# Patient Record
Sex: Male | Born: 1955 | Race: White | Hispanic: No | Marital: Married | Smoking: Current every day smoker
Health system: Southern US, Community
[De-identification: ages and names within clinical notes are randomized; demographics above are authoritative.]

## PROBLEM LIST (undated history)

## (undated) DIAGNOSIS — E785 Hyperlipidemia, unspecified: Secondary | ICD-10-CM

## (undated) DIAGNOSIS — N19 Unspecified kidney failure: Secondary | ICD-10-CM

## (undated) DIAGNOSIS — I10 Essential (primary) hypertension: Secondary | ICD-10-CM

## (undated) DIAGNOSIS — I219 Acute myocardial infarction, unspecified: Secondary | ICD-10-CM

## (undated) HISTORY — DX: Acute myocardial infarction, unspecified: I21.9

## (undated) HISTORY — DX: Essential (primary) hypertension: I10

## (undated) HISTORY — DX: Hyperlipidemia, unspecified: E78.5

---

## 2000-07-08 ENCOUNTER — Ambulatory Visit (HOSPITAL_COMMUNITY): Admission: RE | Admit: 2000-07-08 | Discharge: 2000-07-08 | Payer: Self-pay | Admitting: Orthopedic Surgery

## 2000-07-08 ENCOUNTER — Encounter (INDEPENDENT_AMBULATORY_CARE_PROVIDER_SITE_OTHER): Payer: Self-pay | Admitting: Specialist

## 2004-09-13 ENCOUNTER — Emergency Department (HOSPITAL_COMMUNITY): Admission: EM | Admit: 2004-09-13 | Discharge: 2004-09-13 | Payer: Self-pay | Admitting: Emergency Medicine

## 2004-10-02 ENCOUNTER — Encounter: Admission: RE | Admit: 2004-10-02 | Discharge: 2004-10-02 | Payer: Self-pay | Admitting: Family Medicine

## 2005-12-13 ENCOUNTER — Ambulatory Visit: Payer: Self-pay | Admitting: Family Medicine

## 2005-12-14 ENCOUNTER — Inpatient Hospital Stay (HOSPITAL_COMMUNITY): Admission: EM | Admit: 2005-12-14 | Discharge: 2005-12-15 | Payer: Self-pay | Admitting: *Deleted

## 2005-12-14 ENCOUNTER — Ambulatory Visit: Payer: Self-pay | Admitting: Internal Medicine

## 2005-12-20 ENCOUNTER — Ambulatory Visit: Payer: Self-pay | Admitting: Family Medicine

## 2005-12-27 ENCOUNTER — Ambulatory Visit: Payer: Self-pay | Admitting: Cardiovascular Disease

## 2006-02-10 ENCOUNTER — Encounter (HOSPITAL_COMMUNITY): Admission: RE | Admit: 2006-02-10 | Discharge: 2006-05-11 | Payer: Self-pay | Admitting: Cardiovascular Disease

## 2006-02-10 ENCOUNTER — Ambulatory Visit: Payer: Self-pay | Admitting: Cardiology

## 2006-02-14 ENCOUNTER — Ambulatory Visit: Payer: Self-pay | Admitting: Cardiovascular Disease

## 2006-07-14 ENCOUNTER — Ambulatory Visit: Payer: Self-pay | Admitting: Family Medicine

## 2006-07-15 ENCOUNTER — Ambulatory Visit: Payer: Self-pay | Admitting: Cardiology

## 2006-07-28 ENCOUNTER — Encounter: Payer: Self-pay | Admitting: Cardiology

## 2006-07-28 ENCOUNTER — Ambulatory Visit: Payer: Self-pay

## 2006-08-03 ENCOUNTER — Ambulatory Visit: Payer: Self-pay | Admitting: Cardiovascular Disease

## 2006-09-30 ENCOUNTER — Ambulatory Visit: Payer: Self-pay | Admitting: Family Medicine

## 2006-10-06 ENCOUNTER — Encounter: Admission: RE | Admit: 2006-10-06 | Discharge: 2006-10-06 | Payer: Self-pay | Admitting: Family Medicine

## 2006-11-18 ENCOUNTER — Ambulatory Visit: Payer: Self-pay | Admitting: Cardiovascular Disease

## 2006-11-18 LAB — CONVERTED CEMR LAB
ALT: 43 units/L (ref 0–53)
AST: 40 units/L — ABNORMAL HIGH (ref 0–37)
Alkaline Phosphatase: 87 units/L (ref 39–117)
Bilirubin, Direct: 0.1 mg/dL (ref 0.0–0.3)
Cholesterol: 117 mg/dL (ref 0–200)
HDL: 40.5 mg/dL (ref >39.0)
LDL Cholesterol: 49 mg/dL (ref 0–99)
Total Bilirubin: 0.6 mg/dL (ref 0.3–1.2)
Total Protein: 5.8 g/dL — ABNORMAL LOW (ref 6.0–8.3)

## 2007-02-23 ENCOUNTER — Ambulatory Visit: Payer: Self-pay | Admitting: Family Medicine

## 2007-06-13 ENCOUNTER — Ambulatory Visit: Payer: Self-pay | Admitting: Family Medicine

## 2008-01-09 ENCOUNTER — Ambulatory Visit: Payer: Self-pay | Admitting: Family Medicine

## 2008-03-19 ENCOUNTER — Ambulatory Visit: Payer: Self-pay | Admitting: Cardiovascular Disease

## 2008-09-05 ENCOUNTER — Telehealth: Payer: Self-pay | Admitting: Cardiovascular Disease

## 2008-10-28 ENCOUNTER — Encounter: Payer: Self-pay | Admitting: Cardiovascular Disease

## 2008-10-28 ENCOUNTER — Ambulatory Visit: Payer: Self-pay | Admitting: Family Medicine

## 2009-02-05 ENCOUNTER — Encounter: Payer: Self-pay | Admitting: Cardiology

## 2009-02-26 ENCOUNTER — Encounter (INDEPENDENT_AMBULATORY_CARE_PROVIDER_SITE_OTHER): Payer: Self-pay | Admitting: *Deleted

## 2009-06-10 ENCOUNTER — Ambulatory Visit: Payer: Self-pay | Admitting: Family Medicine

## 2009-07-01 ENCOUNTER — Encounter: Admission: RE | Admit: 2009-07-01 | Discharge: 2009-07-01 | Payer: Self-pay | Admitting: Orthopedic Surgery

## 2009-07-10 ENCOUNTER — Ambulatory Visit: Payer: Self-pay | Admitting: Family Medicine

## 2009-08-27 ENCOUNTER — Ambulatory Visit: Payer: Self-pay | Admitting: Family Medicine

## 2009-08-28 ENCOUNTER — Ambulatory Visit: Payer: Self-pay | Admitting: Family Medicine

## 2009-09-03 ENCOUNTER — Ambulatory Visit: Payer: Self-pay | Admitting: Family Medicine

## 2010-03-24 ENCOUNTER — Ambulatory Visit: Payer: Self-pay | Admitting: Cardiovascular Disease

## 2010-03-24 DIAGNOSIS — I2510 Atherosclerotic heart disease of native coronary artery without angina pectoris: Secondary | ICD-10-CM

## 2010-03-24 DIAGNOSIS — E7800 Pure hypercholesterolemia, unspecified: Secondary | ICD-10-CM | POA: Insufficient documentation

## 2010-05-30 ENCOUNTER — Encounter: Payer: Self-pay | Admitting: Cardiovascular Disease

## 2010-06-09 NOTE — Assessment & Plan Note (Signed)
Summary: f2y   Visit Type:  2 YEAR FOLLOW UP Primary Provider:  Dr Susann Givens  CC:  no complaints.  History of Present Illness: 55 year-old male with CAD presents for followup evaluation. He has not been seen in 2 years. He initially presented with ACS and underwent stenting of the LCx with a drug-eluting stent. He had moderate RCA stenosis and underwent a myocardial perfusion scan without ischemia.   He denies chest pain or tightness. No dyspnea, edema, orthopnea, or PND. No palpitations, lightheadedness, or syncope.  He continues to smoke a few cigarettes each day. He is not engaged in regular exercise.    Current Medications (verified): 1)  Benicar Hct 40-25 Mg Tabs (Olmesartan Medoxomil-Hctz) .... 1/2 Tablet Once Daily 2)  Crestor 20 Mg Tabs (Rosuvastatin Calcium) .... Take One Tablet By Mouth Daily. 3)  Metoprolol Tartrate 25 Mg Tabs (Metoprolol Tartrate) .... Take 2 Tablets in The Morning and 1 Tablet in The Evening 4)  Plavix 75 Mg Tabs (Clopidogrel Bisulfate) .... Take One Tablet By Mouth Daily 5)  Aspirin 81 Mg Tbec (Aspirin) .... Take One Tablet By Mouth Daily  Allergies (verified): No Known Drug Allergies  Past History:  Past medical history reviewed for relevance to current acute and chronic problems.  Past Medical History: CAD s/p PCI 2007 Tobacco abuse HTN Dyslipidemia Chronic low back pain  Social History: Married, 2 children (one grown) Works at [employer] Positive tobacco Social Etoh  Review of Systems       Positive for chronic low back pain and numbness down the right leg, otherwise negative except as per HPI  Vital Signs:  Patient profile:   55 year old male Height:      72 inches Weight:      157.50 pounds BMI:     21.44 Pulse rate:   103 / minute BP sitting:   138 / 84  (left arm) Cuff size:   regular  Vitals Entered By: Caralee Ates CMA (March 24, 2010 11:07 AM)  Physical Exam  General:  Pt is alert and oriented, in no acute  distress. HEENT: normal Neck: normal carotid upstrokes without bruits, JVP normal Lungs: CTA CV: RRR without murmur or gallop Abd: soft, NT, positive BS, no bruit, no organomegaly Ext: no clubbing, cyanosis, or edema. peripheral pulses 2+ and equal Skin: warm and dry without rash    EKG  Procedure date:  03/24/2010  Findings:      Sinus tachycardia 103 bpm, rightward axis, nonspecific T wave changes.  Impression & Recommendations:  Problem # 1:  CORONARY ATHEROSCLEROSIS NATIVE CORONARY ARTERY (ICD-414.01) Pt is stable without angina. He is tolerating dual antiplatelet Rx without bleeding problems. We reviewed the risk/benefit of continuing ASA and plavix and decided to continue these meds. Pt was counseled regarding the importance of tobacco cessation. Otherwise continue current medical program.  His updated medication list for this problem includes:    Metoprolol Tartrate 25 Mg Tabs (Metoprolol tartrate) .Marland Kitchen... Take 2 tablets in the morning and 1 tablet in the evening    Plavix 75 Mg Tabs (Clopidogrel bisulfate) .Marland Kitchen... Take one tablet by mouth daily    Aspirin 81 Mg Tbec (Aspirin) .Marland Kitchen... Take one tablet by mouth daily  Orders: EKG w/ Interpretation (93000)  Problem # 2:  HYPERCHOLESTEROLEMIA (ICD-272.0) Lipids followed by PCP. Goal LDL less than 70 mg/dL.  His updated medication list for this problem includes:    Crestor 20 Mg Tabs (Rosuvastatin calcium) .Marland Kitchen... Take one tablet by mouth daily.  Orders: EKG w/ Interpretation (93000)  CHOL: 117 (11/18/2006)   LDL: 49 (11/18/2006)   HDL: 40.5 (11/18/2006)   TG: 136 (11/18/2006)  Patient Instructions: 1)  Your physician recommends that you continue on your current medications as directed. Please refer to the Current Medication list given to you today. 2)  Your physician wants you to follow-up in: 1 YEAR.  You will receive a reminder letter in the mail two months in advance. If you don't receive a letter, please call our office to  schedule the follow-up appointment. Prescriptions: METOPROLOL TARTRATE 25 MG TABS (METOPROLOL TARTRATE) Take 2 tablets in the morning and 1 tablet in the evening  #90 Tablet x 12   Entered by:   Julieta Gutting, RN, BSN   Authorized by:   Norva Karvonen, MD   Signed by:   Julieta Gutting, RN, BSN on 03/24/2010   Method used:   Electronically to        [facility] [phone]* (retail)       [address]       [place], [place]  [address]       Ph: [phone] or [phone]       Fax: [phone]   RxID:   [id number]

## 2010-09-15 ENCOUNTER — Other Ambulatory Visit: Payer: Self-pay | Admitting: Family Medicine

## 2010-09-17 ENCOUNTER — Telehealth: Payer: Self-pay | Admitting: Family Medicine

## 2010-09-17 MED ORDER — OLMESARTAN MEDOXOMIL 40 MG PO TABS: 40.0000 mg | ORAL_TABLET | Freq: Every day | ORAL | Status: DC

## 2010-09-17 NOTE — Telephone Encounter (Signed)
med renewed

## 2010-09-18 ENCOUNTER — Encounter: Payer: Self-pay | Admitting: Family Medicine

## 2010-09-18 MED ORDER — OLMESARTAN MEDOXOMIL-HCTZ 40-25 MG PO TABS: 1.0000 | ORAL_TABLET | Freq: Every day | ORAL | Status: DC

## 2010-09-18 NOTE — Progress Notes (Signed)
09/18/10 BENICAR/HCTZ REQUIRING P.A. NEED DISCUSS AT NEXT OV-LM

## 2010-09-18 NOTE — Progress Notes (Deleted)
  Subjective:    Patient ID: AH BOTT, male    DOB: [birth year], 55 y.o.   MRN: [id number]  HPI    Review of Systems     Objective:   Physical Exam        Assessment & Plan:

## 2010-09-22 ENCOUNTER — Encounter: Payer: Self-pay | Admitting: Family Medicine

## 2010-09-22 ENCOUNTER — Ambulatory Visit (INDEPENDENT_AMBULATORY_CARE_PROVIDER_SITE_OTHER): Payer: BC Managed Care – PPO | Admitting: Family Medicine

## 2010-09-22 VITALS — BP 130/90 | HR 80 | Ht 61.32 in | Wt 160.0 lb

## 2010-09-22 DIAGNOSIS — Z125 Encounter for screening for malignant neoplasm of prostate: Secondary | ICD-10-CM

## 2010-09-22 DIAGNOSIS — N529 Male erectile dysfunction, unspecified: Secondary | ICD-10-CM | POA: Insufficient documentation

## 2010-09-22 DIAGNOSIS — I10 Essential (primary) hypertension: Secondary | ICD-10-CM | POA: Insufficient documentation

## 2010-09-22 DIAGNOSIS — E785 Hyperlipidemia, unspecified: Secondary | ICD-10-CM

## 2010-09-22 DIAGNOSIS — Z0000 Encounter for general adult medical examination without abnormal findings: Secondary | ICD-10-CM

## 2010-09-22 DIAGNOSIS — Z23 Encounter for immunization: Secondary | ICD-10-CM

## 2010-09-22 DIAGNOSIS — I2510 Atherosclerotic heart disease of native coronary artery without angina pectoris: Secondary | ICD-10-CM

## 2010-09-22 DIAGNOSIS — M549 Dorsalgia, unspecified: Secondary | ICD-10-CM

## 2010-09-22 LAB — COMPREHENSIVE METABOLIC PANEL
ALT: 38 U/L (ref 0–53)
AST: 39 U/L — ABNORMAL HIGH (ref 0–37)
BUN: 16 mg/dL (ref 6–23)
Calcium: 9.4 mg/dL (ref 8.4–10.5)
Chloride: 99 mEq/L (ref 96–112)
Creat: 1.07 mg/dL (ref 0.40–1.50)
Total Bilirubin: 0.7 mg/dL (ref 0.3–1.2)

## 2010-09-22 LAB — POCT URINALYSIS DIPSTICK
Blood, UA: NEGATIVE
Glucose, UA: NEGATIVE
Nitrite, UA: NEGATIVE
Protein, UA: NEGATIVE
Spec Grav, UA: 1.015
Urobilinogen, UA: NEGATIVE
pH, UA: 5

## 2010-09-22 LAB — CBC WITH DIFFERENTIAL/PLATELET
Basophils Absolute: 0 10*3/uL (ref 0.0–0.1)
Eosinophils Absolute: 0.1 10*3/uL (ref 0.0–0.7)
Eosinophils Relative: 1 % (ref 0–5)
HCT: 42 % (ref 39.0–52.0)
Lymphocytes Relative: 33 % (ref 12–46)
MCH: 33.7 pg (ref 26.0–34.0)
MCHC: 33.6 g/dL (ref 30.0–36.0)
MCV: 100.5 fL — ABNORMAL HIGH (ref 78.0–100.0)
Monocytes Absolute: 0.6 10*3/uL (ref 0.1–1.0)
Platelets: 187 10*3/uL (ref 150–400)
RDW: 13.2 % (ref 11.5–15.5)

## 2010-09-22 LAB — LIPID PANEL
Cholesterol: 174 mg/dL (ref 0–200)
HDL: 55 mg/dL (ref >39)
Total CHOL/HDL Ratio: 3.2 Ratio
VLDL: 38 mg/dL (ref 0–40)

## 2010-09-22 MED ORDER — LOSARTAN POTASSIUM-HCTZ 50-12.5 MG PO TABS: 1.0000 | ORAL_TABLET | Freq: Every day | ORAL | Status: DC

## 2010-09-22 NOTE — Patient Instructions (Signed)
Your new blood pressure medicine was called in. Continue to work on smoking cessation and return here if you want more help. Her here in one month for blood pressure recheck. Change to work on your physical activities to increase this as much as possible.

## 2010-09-22 NOTE — Assessment & Plan Note (Signed)
[facility]                            CARDIOLOGY OFFICE NOTE   MITESH, ROSENDAHL                   MRN:          [id number]  DATE:03/19/2008                            DOB:          September 07, 1955    REASON FOR VISIT:  CAD.   HISTORY OF PRESENT ILLNESS:  Mr. Harriott is a 55 year old gentleman  with CAD who initially presented with an acute coronary syndrome in  2007.  He was found to have a severe ulcerated plaque in the left  circumflex and was treated with a drug-eluting stent.  He has done quite  well since his initial intervention.  He had an episode of chest pain in  2008, and underwent a nuclear stress study dated July 28, 2006.  This  demonstrated normal homogenous uptake with normal perfusion in all areas  and an ejection fraction of 68%.  From a symptomatic standpoint, he is  currently doing well.  He has no complaints.  He specifically denies  chest pain, dyspnea, edema, palpitations, lightheadedness, or syncope.   He is active, but does not engage in any formal exercise program.  He  has no symptoms with exertion.   MEDICATIONS:  1. Plavix 75 mg daily.  2. Benicar 20 mg daily.  3. Aspirin 81 mg daily.  4. Crestor 10 mg daily.  5. Metoprolol 25 mg two in the morning and one in the evening   ALLERGIES:  NKDA.   PHYSICAL EXAMINATION:  GENERAL:  The patient is alert and oriented.  He  is in no acute distress.  VITAL SIGNS:  Blood pressure 140/90, heart rate 82, respiratory rates  12, weight is 160 pounds.  HEENT:  Normal.  NECK:  Normal carotid upstrokes.  No bruits.  JVP normal.  LUNGS:  Clear bilaterally.  HEART:  Regular rate and rhythm.  No murmurs or gallops.  ABDOMEN:  Soft, nontender.  No organomegaly.  EXTREMITIES:  No clubbing, cyanosis, or edema.  Peripheral pulses are  intact and equal.  SKIN:  Warm and dry.  No rash.   EKG shows normal sinus rhythm and is within normal limits.  Heart rate  is 81 beats per  minute.   ASSESSMENT:  1. Coronary artery disease.  The patient is stable with no new      symptoms.  He is tolerating his medical program well.  I encouraged      him to do more vigorous exercise on a regular basis.  I would like      to see him back in 1 year for followup.  2. Hypertension.  Blood pressure borderline in the office today.  He      reports good control at home.  Continue current medical program.  3. Dyslipidemia.  His last lipid panel was drawn on January 09, 2008,      by Dr. Susann Givens and showed a cholesterol of 163, triglyceride of      137, HDL 65, and LDL 71.  He is at goal with a very good HDL.  We      will continue low-dose Crestor without change.   For  followup, I will see Mr. Stanbery in 1 year.     Veverly Fells. Excell Seltzer, MD  Electronically Signed    MDC/MedQ  DD: 03/26/2008  DT: 03/26/2008  Job #: [id number]   cc:   Sharlot Gowda, M.D.

## 2010-09-22 NOTE — Progress Notes (Signed)
Subjective:    Patient ID: George Benson, male    DOB: 01/08/1956, 55 y.o.   MRN: [id number]  HPI he is here for a complete examination. He continues on medications listed in the chart. He'll need his blood pressure medicine changed due to insurance purposes. He has not had any difficulty with chest pain, shortness of breath. He has seen his cardiologist recently. He has been having difficulty with back pain. He has a previous history of epidural injections which did work quite well. He is considering having these again. He does complain of numbness in his toes but then states that he can feel the same in  his feet. Continues to smoke but is making an effort to quit smoking. He realizes he is not exercising as much as he should. Work continues to be stressful which she says contributes to smoking. His home life is going well. Family history is unchanged.   Review of Systems  Constitutional: Negative.   HENT: Negative.   Respiratory: Negative.   Gastrointestinal: Negative.   Genitourinary: Negative.   Musculoskeletal: Negative.   Skin: Negative.   Hematological: Negative.  Negative for adenopathy.  Psychiatric/Behavioral: Negative.        Objective:   Physical Exam BP 130/90  Pulse 80  Ht 5' 1.32" (1.558 m)  Wt 160 lb (72.576 kg)  BMI 29.92 kg/m2  General Appearance:    Alert, cooperative, no distress, appears stated age  Head:    Normocephalic, without obvious abnormality, atraumatic  Eyes:    PERRL, conjunctiva/corneas clear, EOM's intact, fundi    benign  Ears:    Normal TM's and external ear canals  Nose:   Nares normal, mucosa normal, no drainage or sinus   tenderness  Throat:   Lips, mucosa, and tongue normal; teeth and gums normal  Neck:   Supple, no lymphadenopathy;  thyroid:  no   enlargement/tenderness/nodules; no carotid   bruit or JVD  Back:    Spine nontender, no curvature, ROM normal, no CVA     tenderness  Lungs:     Clear to auscultation bilaterally without  wheezes, rales or     ronchi; respirations unlabored  Chest Wall:    No tenderness or deformity   Heart:    Regular rate and rhythm, S1 and S2 normal, no murmur, rub   or gallop  Breast Exam:    No chest wall tenderness, masses or gynecomastia  Abdomen:     Soft, non-tender, nondistended, normoactive bowel sounds,    no masses, no hepatosplenomegaly  Genitalia:    Normal male external genitalia without lesions.  Testicles without masses.  No inguinal hernias.     Extremities:   No clubbing, cyanosis or edema  Pulses:   2+ and symmetric all extremities  Skin:   Skin color, texture, turgor normal, no rashes or lesions  Lymph nodes:   Cervical, supraclavicular, and axillary nodes normal  Neurologic:   CNII-XII intact, normal strength, sensation and gait; reflexes 2+ and symmetric throughout          Psych:   Normal mood, affect, hygiene and grooming.          Assessment & Plan:  see chronic problem list Discussed smoking cessation with him. Encouraged him to continue with his present slow reduction. Also encouraged him to become more physically active. We discussed stress and stress management especially using the serenity prayer. Will be switched to different hypertensive medication. He is to return here in one month for recheck.

## 2010-09-24 ENCOUNTER — Telehealth: Payer: Self-pay

## 2010-09-24 NOTE — Telephone Encounter (Signed)
Pt wife aware of labs she said she would let him know

## 2010-09-25 NOTE — Assessment & Plan Note (Signed)
[facility]                            CARDIOLOGY OFFICE NOTE   George Benson, George Benson                   MRN:          [id number]  DATE:08/03/2006                            DOB:          08-18-1955    George Benson is a 55 year old gentleman with coronary artery disease  and a recent episode of chest pain, presenting today for followup after  an exercise Myoview stress test.   He was seen by George Benson in the clinic on March 7 for evaluation  after an episode of substernal chest tightness, as well as shoulder  tightness, light-headedness and diaphoresis.  This occurred at rest.  He  did not have any exertional symptoms.  Since that visit, he has done  well.  He has had a few episodes of fleeting chest pain, following  lunch.  He has not had any exertional symptoms.  He has undergone an  exercise Myoview study and had no chest pain during exercise on the  treadmill.  His episodes of pain following eating have occurred on a few  occasions and last for a matter of seconds, then resolve spontaneously.  He has been active and has performed activities such as mowing his lawn  and golfing, as well as playing with his young son.  He has had no  symptoms with activity.   CURRENT MEDICATIONS INCLUDE:  1. Plavix 75 mg daily.  2. Aspirin 81 mg daily.  3. Lopressor 25 mg twice daily.  4. Benicar 20 mg daily.  5. Lipitor 80 mg at bedtime.   ALLERGIES:  NKDA.   EXAMINATION:  The patient is alert and oriented.  He is in no acute  distress.  Weight is 162 pounds, blood pressure is 131/86, heart rate is  71, respiratory rate is 12.  HEENT:  Normal.  NECK:  Normal carotid upstrokes without bruits.  Jugular venous pressure  is normal.  LUNGS:  Clear to auscultation bilaterally.  HEART:  The apex is discrete and nondisplaced.  The heart is regular  rate and rhythm without murmurs or gallops.  ABDOMEN:  Soft, nontender, no organomegaly, no abdominal  bruits.  EXTREMITIES:  There is no clubbing, cyanosis or edema.  There is diffuse  bruising of the right arm, from the elbow to the wrist.  Peripheral  pulses are 2+ and equal throughout.   Exercise Myoview stress study, performed on March 20, as follows:  Mr.  Benson exercised for 8 minutes on the Bruce protocol.  He had no  chest pain or significant ST or T-wave changes.  Nuclear images were  normal with no evidence of ischemia.  The gated ejection fraction was  68%.  There were no arrhythmias.   Echocardiogram, performed on March, demonstrated normal left ventricular  systolic function and mild aortic valve regurgitation.  There were no  other significant abnormalities seen.  The calculated LVEF was 58%.   ASSESSMENT:  George Benson is stable from a cardiac standpoint.  His  cardiac problems are as follows:   1. Coronary artery disease.  He remains on dual antiplatelet therapy      with aspirin  and clopidogrel.  He will be at one year of dual      antiplatelet therapy in August of this year.  At that point, we      will have a discussion to see whether we will continue him on      clopidogrel.  The easy bruisability is a major problem and likely      we will discontinue clopidogrel at that point.  He should remain on      aggressive secondary risk factor modification, as detailed below.      His exercise Myoview study is reassuring and I think his chest pain      episodes are unrelated to ischemic heart disease.  2. Hypertension.  He continues on metoprolol and Benicar.  I have      asked him to increase his metoprolol to 50 mg in the morning and 25      mg in the evening.  He will remain on 20 mg of Benicar daily.  3. Dyslipidemia.  He remains on high-dose Lipitor.  He is tolerating      this well.  He is due for lipids and LFTs in June of this year.      His last LFTs were in October of 2007 and his AST was 39 and ALT      was 40.  His LDL cholesterol was 69 and total  cholesterol was 149.      His HDL was 57.   FOLLOWUP:  I will plan on seeing George Benson back in 6 months.  We  will follow up with him by telephone after his lipids and LFTs are  available in June.     Veverly Fells. Excell Seltzer, MD  Electronically Signed    MDC/MedQ  DD: 08/03/2006  DT: 08/03/2006  Job #: [id number]   cc:   Sharlot Gowda, M.D.

## 2010-09-25 NOTE — Assessment & Plan Note (Signed)
[facility]                            CARDIOLOGY OFFICE NOTE   George, Benson                   MRN:          [id number]  DATE:07/15/2006                            DOB:          [birth year]    PRIMARY:  Dr. Susann Givens.   REASON FOR PRESENTATION:  Evaluate the patient for chest pain.   HISTORY OF PRESENT ILLNESS:  The patient is a 55 year old gentleman with  coronary disease as described below.  He last saw Dr. Excell Seltzer in October  and had a stress perfusion study to follow up some nonobstructive  disease and his recent stent.  There was no ischemia.  The EF was 62%.   He had been doing well.  He walks a lot at work and walks quickly.  He  walks with his 69-year-old son as well.  With this, he has not had any  chest discomfort, neck discomfort, or arm discomfort.  He has had no  palpitations, pre-syncope, or syncope.  He has had no shortness of  breath.   Yesterday, while sitting at his desk, he developed chest discomfort  similar to that, which he had with his previous pain.  He described a  feeling like his blood pressure was going up.  He developed bilateral  shoulder tightness.  He felt light-headed.  He got slightly diaphoretic.  He developed tremor.  His hands began to tingle.  He took a  nitroglycerin.  The symptoms improved.  While this was similar to his  previous complaints, it was not as severe.  He had no recurrent symptoms  following this.  He has not had any problems overnight.  He saw Dr.  Susann Givens and is referred here.   Of note, when he presented with similar complaints previously, he did  not have a myocardial infarction.  He did have disease as described  below.   PAST MEDICAL HISTORY:  Coronary artery disease (LAD mid 40% stenosis,  second diagonal 75% stenosis, the circumflex had proximal 30% stenosis,  mid circumflex had a 90% lesion before an obtuse marginal, the first  obtuse marginal had 60% stenosis.  The right  coronary artery had a  proximal 50% stenosis, the mid right coronary artery had 60% stenosis.  The EF was 60%).  Hypertension.  Dyslipidemia.  Tobacco abuse.   ALLERGIES:  None.   MEDICATIONS:  1. Plavix 75 mg daily.  2. Lipitor 80 mg nightly.  3. Benicar 20 mg daily.  4. Lopressor 25 mg b.i.d.  5. Aspirin 81 mg daily.   REVIEW OF SYSTEMS:  As stated in the HPI and otherwise negative for  other systems.   PHYSICAL EXAMINATION:  The patient is in no distress.  Blood pressure 146/97, heart rate 64 and regular, weight 160 pounds,  body mass index 21.  HEENT:  Eyelids unremarkable.  Pupils are equal, round, and reactive to  light and accommodation.  Fundi not visualized.  Oral mucosa  unremarkable.  NECK:  No jugular venous distension at 45 degrees, carotid upstroke  brisk and symmetric, no bruits, thyromegaly.  LYMPHATICS:  No cervical, axillary, or inguinal adenopathy.  LUNGS:  Clear to auscultation bilaterally.  BACK:  No costovertebral angle tenderness.  CHEST:  Unremarkable.  HEART:  PMI not displaced or sustained, S1 and S2 within normal limits,  no S3, no S4, positive midsystolic click, slight systolic murmur, non-  radiating, heard only at the apex.  No diastolic murmurs.  ABDOMEN:  Flat, positive bowel sounds, normal in frequency and pitch, no  bruits, rebound, guarding.  No midline pulsatile masses, hepatomegaly.  SKIN:  No rashes, no nodules.  EXTREMITIES:  With 2+ pulses throughout, no edema, cyanosis, clubbing.  NEURO:  Oriented to person, place, and time, cranial nerves 2-12 grossly  intact, motor grossly intact throughout.   ELECTROCARDIOGRAM:  Sinus rhythm, rate 64, axis within normal limits.  Intervals within normal limits.  No acute ST-T wave changes.   ASSESSMENT AND PLAN:  Chest discomfort.  The patient had 1 episode of  chest discomfort yesterday.  This was similar to previous pain, at which  time he had ruled out for myocardial infarction.  He did have   obstructive coronary disease at that time that was treated.  However,  the pain now has predominantly atypical features.  I think the pre-test  probability that this is a recurrent obstructive coronary disease or new  obstructive high-grade large vessel disease is only moderate.  I think  in this situation, it is prudent to start with an exercise perfusion  study.  I think a catheterization might be somewhat confusing as we know  he has some moderate disease and it might be difficult to identify a  culprit.  He is going to continue with his sublingual nitroglycerin and  other medications.     George Rotunda, MD, [facility]  Electronically Signed    JH/MedQ  DD: 07/15/2006  DT: 07/15/2006  Job #: [id number]

## 2010-09-25 NOTE — Cardiovascular Report (Signed)
NAMESAHIB, PELLA NO.:  [id number]   MEDICAL RECORD NO.:  [id number]          PATIENT TYPE:  INP   LOCATION:  2807                         FACILITY:  [facility]   PHYSICIAN:  Micheline Chapman, MD   DATE OF BIRTH:  [birth year]   DATE OF PROCEDURE:  12/14/2005  DATE OF DISCHARGE:                              CARDIAC CATHETERIZATION   PERFORMING PHYSICIAN:  Micheline Chapman, MD   PROCEDURE:  1. Left heart catheterization.  2. Selective coronary angiogram.  3. Left ventricular angiogram.  4. Percutaneous transluminal coronary angioplasty and stenting of the      second obtuse marginal of the left circumflex.  5. AngioSeal closure of the right femoral artery.   INDICATIONS:  Mr. Coupe is a very pleasant, 55 year old male who had  acute onset of chest pressure, diaphoresis and lightheadedness this morning;  and presented to the emergency department with some mild hyperacute,  inferior T wave changes; and isolated PVCs on electrocardiogram.  His pain  improved with aspirin and sublingual nitroglycerin.  With this classic  presentation, he was referred for urgent cardiac catheterization.   Risks and indications were reviewed with the patient and his wife.  They  fully understood and informed consent was obtained.  The right groin was  prepped and draped in a normal sterile fashion and anesthetized with 1%  lidocaine.  Arterial access was achieved using the modified Seldinger  technique without problems.  A 6-French arterial sheath was placed in the  right femoral artery.  Diagnostic catheters included a 6-French angled  pigtail JL-4, JR-4 and no torque right.  All catheter exchanges were  performed over a wire.   FINDINGS:  1. Aortic pressure was 117/85 with a mean of 99.  2. Left ventricular pressure was 115/0 with an end-diastolic pressure of      4.  There was no transaortic valve gradient.  3. The left mainstem was normal.  LAD had mild nonobstructive  disease      proximally of about 20%.  The mid-LAD had 40% disease and the remainder      of the distal LAD was normal.  The LAD gave off 2 diagonal branches,      the second of which was the largest branch.  It had a 75% ostial      stenosis.  4. The left circumflex had 30% proximal disease.  The mid left circumflex      had a 90% lesion and distally this branched into the second obtuse      marginal and was normal.  The left circumflex also gave off a first      obtuse marginal which had 60% ostial disease.  The AV groove circumflex      had no disease and was small diameter.  5. Right coronary artery.  There was diffuse disease in the proximal and      mid right coronary artery.  Proximally the vessel had 50% stenosis.  In      the mid right coronary artery there was a focal 60% stenosis.  The      remainder of the  right coronary artery appeared angiographically      normal.  It ended in a posterior descending branch and was a dominant      vessel.   LEFT VENTRICULOGRAM:  Done in the RAO projection demonstrated normal left  ventricular function with an estimated left ventricular ejection fraction of  60%.   Because of the acute presentation and high-grade disease in the left  circumflex system with the 90% stenosis of the second obtuse marginal, we  proceeded with percutaneous angioplasty and stenting of this vessel.  The  patient was on heparin and his initial ACT was 226.  We, therefore, used  eptifibatide in addition to heparin for anticoagulation.  A 6-French XB 3.0  guiding catheter was used and the lesion was crossed with a Prowater wire.  The lesion was predilated with a 2.5 x 15 mm balloon and there was a small  dissection plane after balloon dilatation.  Following intracoronary  nitroglycerin a 2.5 x 20 mm Taxus stent was deployed in the second obtuse  marginal which completely covered the dissection plane.  There was an  excellent angiographic result following deployment  of this stent.  The stent  was postdilated with a 2.5 x 15 mm noncompliant balloon. The patient had no  complications.  He did have mild chest discomfort at the end of the  procedure likely due to occlusion of a very small side branch which was  filling late after stent deployment.   ASSESSMENT:  1. Unstable angina with high-grade disease in the left circumflex system,      now status post percutaneous coronary intervention.  2. Moderate right coronary artery disease.  3. Mild-to-moderate LAD disease.  4. Normal left ventricular function.   As above, PTCA and stenting was performed of the second obtuse marginal.  Will plan on 18 hours of Integrilin.  The patient was loaded with 600 mg of  Plavix in the catheterization lab.  We will also plan on a myocardial  perfusion scan in approximately 6 weeks' time to evaluate for right coronary  artery distribution ischemia as he has moderate disease in this vessel.      Micheline Chapman, MD  Electronically Signed     MDC/MEDQ  D:  12/14/2005  T:  12/14/2005  Job:  [id number]   cc:   Arvilla Meres, M.D. [facility]

## 2010-09-25 NOTE — Letter (Signed)
December 27, 2005     Sharlot Gowda, MD  [address]  [place], [place] [address]   RE:  George Benson, George Benson  MR:  [id number] / DOB:  Aug 05, 2055   Dear Dr. Susann Givens:   It was my pleasure to see Lebaron Bautch in followup today after his  recent hospitalization for unstable angina.   As you know, he is a very pleasant 55 year old man who presented to [facility] with unstable angina on August 7 and at that time he underwent  a cardiac catheterization that demonstrated high-grade left circumflex  disease and subsequently underwent angioplasty and stenting of this vessel  with placement of a drug-eluting stent.  Post procedure, he has done well.  He reports today that he has had some bruising and discomfort in his right  groin area.  He has also had some minor swelling in his right ankle.  Other  than that, he has no complaints.  He specifically denies chest pain,  shortness of breath, palpitations, lightheadedness or syncope.  He denies  any claudication symptoms.  He is tolerating his new medications well.  He  does describe easy bruising since he has started aspirin and Plavix in  conjunction.  He has been able to reduce his cigarette usage and is down to  4 cigarettes per day.  His wife is quitting with him and they plan to be  completely off of cigarettes by the end of the week.  He has no other  complaints today.   His current medications include aspirin 325 mg daily, clopidogrel 75 mg  daily, Lipitor 80 mg daily, Benicar 20 mg daily and Lopressor 25 mg twice  daily.  He has nitroglycerin to be used as needed but he has not required  this medication.   The remainder of his past medical, family, social history were reviewed per  his hospital records.  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  He is married with a 57-year-old and 50 year old children.  He has  no family history of coronary artery disease.   On examination today, he is alert and  oriented, healthy-appearing, white  male in no acute distress.  His weight is 152 pounds.  His blood pressure is  124/80 and heart rate is 66.  HEENT:  Sclerae anicteric.  Conjunctivae pink.  Moist oral mucosa.  NECK:  Normal carotid upstrokes without bruits, jugular venous pressure is  normal.  LUNGS:  Clear to auscultation bilaterally.  CARDIOVASCULAR:  The PMI is discrete, nondisplaced.  The heart is regular  rate and rhythm without murmurs, gallops or rubs.  ABDOMEN:  Soft, nontender, no organomegaly.  No abdominal bruits are  present.  EXTREMITIES:  The right groin shows some mild ecchymoses but the right  femoral pulse is normal.  There is no mass.  There is no femoral bruit.  The  left femoral artery is normal to palpation.  The right calf is nontender to  palpation.  There is no calf swelling.  There is trace right pedal edema  with 2+ posterior tibial and dorsalis pedis pulses on the right and left  feet.  NEUROLOGIC:  Exam is grossly intact.   ASSESSMENT AND PLAN:  Mr. Mosie Epstein is a 55 year old gentleman with recent  percutaneous coronary intervention for treatment of unstable angina.  His  problems are as follows:  1. Coronary artery disease.  He remains on dual antiplatelet therapy with      aspirin and Plavix.  I  recommend he continues on this for a minimum of      12 months.  He did have moderate coronary disease in his right coronary      artery at the time of his catheterization.  This did not appear to be      his culprit vessel.  I have recommended that he undergo an exercise      myocardial perfusion scan to evaluate for any ischemia in this region.      If he remains asymptomatic, it would be unlikely that I would proceed      with intervention of this vessel unless he has a large area of ischemia      on his nuclear study.  2. Dyslipidemia.  His lipid panel from the hospital demonstrated an LDL      cholesterol of 123.  His HDL was 52.  Triglycerides level was 89  and      total cholesterol was 193.  He was started on atorvastatin 80 mg daily.      Will recheck a lipid panel when he returns in 6 weeks as well as LFTs      to rule out any hepatotoxicity from his atorvastatin.  3. Hypertension.  Will continue therapy with Benicar and Lopressor.  He      appears to be well-controlled.   Will plan on following up with Mr. Mosie Epstein in approximately 8 weeks' time  and will contact him by telephone after the results of his stress test are  back.  He was advised that if the swelling in  his right foot becomes worse or if he develops more significant right calf  pain then I would like to bring him in for a duplex ultrasound of this leg.    Sincerely,      Micheline Chapman, MD   MDC/MedQ  DD:  12/27/2005  DT:  12/27/2005  Job #:  [id number]

## 2010-09-25 NOTE — Letter (Signed)
February 14, 2006     Sharlot Gowda, M.D.  [address]  [place], [place] [address]   RE:  George, Benson  MRN:  [id number]  /  DOB:  [birth year]   Dear Dr. Susann Givens:   I saw George Benson in follow up at the [facility] this  morning.  As you will recall, he is a very pleasant 55 year old man with  hospital admission in August for unstable angina who is now status post  stenting of his left circumflex with a drug eluting stent.  He has done well  in the interim.  He is maintained on aspirin and Plavix for dual  antiplatelet therapy after his stent.  He has noted very easy bruising since  these medications have been started.  He has multiple bruises on his forearm  and antecubital regions from blood draws, and also has a bruise on his chest  wall from leaning over his car door.  He has had no overt bleeding.   He reports no chest pain or pressure.  He has not had any dyspnea.  He  maintains an active lifestyle. 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  He is up on his feet and he does a  great deal of walking.  He also walks quite a bit at night and plays golf  regularly.  He continues to smoke cigarettes, less than one half pack per  day.  He has no other complaints at this time.   CURRENT MEDICATIONS:  1. Aspirin 81 mg daily.  2. Clopidogrel 75 mg daily.  3. Lipitor 80 mg daily.  4. Benicar 20 mg daily.  5. Lopressor 25 mg b.i.d.   ALLERGIES:  No drug allergies.   PHYSICAL EXAMINATION:  GENERAL:  Alert and oriented in no acute distress.  VITAL SIGNS:  Blood pressure 134/80, weight 154 pounds, heart rate 75,  respirations 12.  HEENT:  Sclerae are anicteric.  Conjunctivae pink.  NECK:  Normal carotid upstrokes without bruits.  Jugular venous pressure is  normal.  LUNGS:  Clear to auscultation bilaterally.  CARDIOVASCULAR:  The apex is discrete and nondisplaced.  HEART:  Regular rate and rhythm without murmurs or  gallops.  ABDOMEN:  Soft and nontender.  The chest wall has an ecchymotic region on  the right anterior chest that appears to be healing.  EXTREMITIES:  No cyanosis, clubbing or edema.  Peripheral pulses are 2+ and  equal throughout.  There is some ecchymoses in the bilateral antecubital  regions.   LABORATORY DATA:  The patient had a lipid panel drawn on October 4.  He has  had an excellent response to high dose Lipitor.  His cholesterol is 149 with  a triglyceride of 116.  His HDL cholesterol is 57 and his LDL cholesterol is  69.   He underwent an Adenosine Myoview stress test on October 4.  This  demonstrated diaphragmatic attenuation versus a small prior inferior  infarct.  There was no ischemia on the study.  His overall ejection fraction  was 62% and his wall motion was normal.   ASSESSMENT:  1. Coronary artery disease.  He remains stable on dual antiplatelet      therapy.  He should continue this for a minimum of 12 months with      indefinite aspirin.  His right coronary artery disease which was      treated medically does not appear to be causing any ischemia as  he had      a low risk stress test result.  He continues to be asymptomatic.  He      should continue on aggressive medical therapy.  2. Dyslipidemia.  He has had an excellent response to a atorvastatin as      his LDL cholesterol has dropped from 123 to 69.  He has had an      increased HDL from 52-57.  I would like him to continue on this      indefinitely.  He needs liver function testing done, and we have      scheduled this for today.  3. Hypertension.  Will continue Benicar and Lopressor.  I opted not to      increase his Lopressor dose today because he complains of some mild      fatigue.  4. Easy bruisability.  This is likely secondary to therapy with aspirin      and Plavix.  Will check a CBC just to rule out thrombocytopenia.   I plan on seeing George Benson in six months for follow up.  If he has any   problems in the interim, please feel free to contact me at any time.   Sincerely,     George Benson. Excell Seltzer, MD   MDC/MedQ  /  Job #:  [id number]  DD:  02/14/2006 / DT:  02/15/2006

## 2010-09-25 NOTE — Op Note (Signed)
[facility]  Patient:    George Benson, George Benson                   MRN: [id number] Proc. Date: 07/08/00 Adm. Date:  [id number] Disc. Date: [id number] Attending:  Marlowe Kays Page                           Operative Report  PREOPERATIVE DIAGNOSIS:  Painful plantar fibroma left foot.  POSTOPERATIVE DIAGNOSIS:  Painful plantar fibroma left foot.  OPERATIVE PROCEDURE:  Partial plantar fasciectomy left foot with excision of plantar fibroma.  SURGEON:  Illene Labrador. Aplington, M.D.  ASSISTANT:  Nurse.  ANESTHESIA:  General.  INDICATIONS:  The patient had a painful mass on the plantar surface of the foot measuring about 6 cm in length x 2 cm in width.  DESCRIPTION OF PROCEDURE:  After satisfactory general anesthesia, pneumatic tourniquet, and DuraPrep to the foot and ankle was draped as a sterile field. A 7 cm incision was made along the inner aspect of the longitudinal arch of the foot. The marginal plantar fascia at this level was sectioned along the skin incision and carefully dissected out both beneath the skin and top of the underlying neuromuscular tissues. Distally near the base of the first metatarsal head, the abnormal plantar fascia was sectioned and then I began working down the lateral side and working distally sizing the 6 x 2 cm area of abnormal plantar fascia which encompassed the plantar fibroma. Potential bleeders were coagulated along the way. The wound was then irrigated with sterile saline and soft tissue was infiltrated with 0.50% plain Marcaine. The subcutaneous tissue was reapproximated with interrupted 3-0 Vicryl and the skin with interrupted 4-0 nylon mattress sutures. A dry, sterile dressing was applied. The tourniquet was released. He tolerated the procedure well and was taken to recovery in satisfactory condition with no known complications. DD:  07/08/00 TD:  07/11/00 Job: [id number]

## 2010-09-25 NOTE — Op Note (Signed)
[facility]  Patient:    YAASIR, MENKEN                   MRN: [id number] Proc. Date: 07/08/00 Adm. Date:  [id number] Disc. Date: [id number] Attending:  Marlowe Kays Page                           Operative Report  PREOPERATIVE DIAGNOSIS:  Painful plantar fibroma left foot.  POSTOPERATIVE DIAGNOSIS:  Painful plantar fibroma left foot.  OPERATION:  Partial plantar fasciectomy left foot.  Excision plantar fibroma.  SURGEON:  Illene Labrador. Aplington, M.D.  ASSISTANT:  Nurse.  ANESTHESIA:  General.  PATHOLOGY AND JUSTIFICATION FOR PROCEDURE:  He had a painful mass on the plantar surface of the foot measuring about 6 cm in length x 2 cm in width.  DESCRIPTION OF PROCEDURE:  Satisfactory general anesthesia, pneumatic tourniquet, Duraprep to foot and ankle and draped in a sterile field.  A 7 cm incision was made on the inner aspect of the longitudinal arch of the foot. The origin of the plantar fascia at this level was sectioned along the skin incision and carefully dissected out, both beneath the skin and on top of the underlying intermuscular tissues.  Distally near the base of the first metatarsal head, the abnormal plantar fascia was sectioned, and then I began working down the lateral side and working distally, incising the 6 x 2 cm area of abnormal plantar fascia which encompassed the the plantar fibroma. Potential bleeders were coagulated along the way.  It was then irrigated with sterile saline, and soft tissue was infiltrated with 0.5% plain Marcaine. Subcutaneous tissue was reapproximated with interrupted 3-0 Vicryl and the skin with interrupted 4-0 nylon mattress sutures.  Betadine, Adaptic dry sterile dressing were applied.  Tourniquet was released.  He tolerated the procedure well and was taken to the recovery room in satisfactory condition with no known complications. DD:  07/08/00 TD:  07/11/00 Job: [id number]

## 2010-09-25 NOTE — H&P (Signed)
George Benson, George NO.:  [id number]   MEDICAL RECORD NO.:  [id number]          PATIENT TYPE:  INP   LOCATION:  2807                         FACILITY:  [facility]   PHYSICIAN:  Arvilla Meres, M.D. [facility] OF BIRTH:  05-Dec-1955   DATE OF ADMISSION:  12/14/2005  DATE OF DISCHARGE:                                HISTORY & PHYSICAL   PRIMARY CARDIOLOGIST:  New, will be Dr. Nicholes Mango.   PRIMARY CARE PHYSICIAN:  Dr. Susann Givens   CHIEF COMPLAINT:  Chest pain.   HISTORY OF PRESENT ILLNESS:  George Benson is a 55 year old white male with  no previous history of coronary artery disease.  He had onset of substernal  chest pain at approximately 10:30 a.m. this morning while at work.  He was  sitting still at the time.  His initial symptom was dizziness and  presyncope.  Then he rapidly developed chest tightness which reached an 8/10  in about 10 minutes.  He had shortness of breath with this but denies  nausea, vomiting or diaphoresis.  He told a coworker who took him by car to  Dr. Jola Babinski office.  Dr. Susann Givens gave him sublingual nitroglycerin x2 as  well as aspirin 81 mg x1.  An EKG was abnormal and he was sent by EMS to the  emergency room.  By the time he arrived at the emergency room his pain had  decreased to a 2/10 and the dizziness and shortness of breath had resolved.  He has never had these kinds of symptoms before.  He played golf 2 days ago  and was very diaphoretic at the time but he thought it was because of the  heat and did not have any chest pain, so he did not feel like he was having  any unusual symptoms.  He has otherwise been in his usual state of health.   PAST MEDICAL HISTORY:  He denies a history of diabetes or hyperlipidemia  although he has not had a recent lipid profile.  He has hypertension for  which he takes medication.  There is no family history of premature coronary  artery disease.  He has ongoing tobacco use and moderate alcohol  use.   SURGICAL HISTORY:  He has had left foot surgery.   ALLERGIES:  No known drug allergies.   MEDICATIONS:  Benicar daily, dose unclear, likely either 10 mg or 20 mg.   SOCIAL HISTORY:  He lives in [place] with his wife and works in Research scientist (life sciences)  parts.  He has a greater than 50 pack-year history of tobacco use.  He  drinks four or five beers a day per his wife.  He denies drug use.   FAMILY HISTORY:  His parents are both living in their 65s and neither one  has heart disease.  He has no siblings with premature coronary artery  disease.   REVIEW OF SYSTEMS:  The chest pain and shortness of breath as described  above.  He has some dyspnea on exertion that has not recently changed.  He  rarely cough and denies wheezing but does admit to snoring.  He has some  chronic arthralgias in his left foot and has had surgery on that foot.  He  denies regular reflux symptoms, hematemesis, hemoptysis or melena.  Review  of systems is otherwise negative.   PHYSICAL EXAMINATION:  VITAL SIGNS:  Temperature is 97.2, blood pressure  121/83, pulse 102, respiratory rate 20, O2 saturation 100% on 2 liters.  GENERAL:  He is a well-developed, well-nourished white male in no acute  distress.  HEENT:  Head is normocephalic and atraumatic with pupils equal, round, and  reactive to light and accommodation.  Extraocular movements intact.  Sclerae  clear.  Nares without discharge.  NECK:  There is no lymphadenopathy, thyromegaly, bruit, or JVD noted.  LUNGS:  Are essentially clear to auscultation bilaterally.  SKIN:  No rashes or lesions are noted.  ABDOMEN:  Soft and nontender with active bowel sounds.  There is no  hepatosplenomegaly noted.  EXTREMITIES:  There is no cyanosis, clubbing or edema.  His distal pulses  are 2+ in all four extremities and no femoral bruits are appreciated.  MUSCULOSKELETAL:  He has got no joint deformity or effusions and no spine or  CVA tenderness.  NEUROLOGIC  He is alert and  oriented with cranial nerves II-XII grossly  intact.   Initial EKG at Dr. Jola Babinski office is sinus tachycardia, rate 126, with  questionable posterior changes.   Laboratory values and chest x-ray are pending at the time of dictation.   IMPRESSION:  Chest pain.  Dr. Gala Romney was in to see and examine Mr.  [name].  The presentation is concerning for unstable angina.  He will be  started on IV nitroglycerin and heparin.  He has had aspirin.  He will be  taken to the cardiac catheterization laboratory.  A D-dimer will be checked  as well, although he is at relatively low risk for PE.  A fasting lipid  profile will be obtained as well.  By report of i-STAT his blood sugar was  122 and he has not eaten today.  Will check a hemoglobin A1c as well.  Smoking cessation is strongly encouraged to the patient and his wife.  Further evaluation and treatment will depend on the results of the heart  catheterization.  Dr. Nicholes Mango saw the patient and determined the plan of care.      Theodore Demark, P.A. [facility]      Arvilla Meres, M.D. [facility]  Electronically Signed    RB/MEDQ  D:  12/14/2005  T:  12/14/2005  Job:  [id number]

## 2010-09-25 NOTE — Discharge Summary (Signed)
George Benson, KERVIN NO.:  [id number]   MEDICAL RECORD NO.:  [id number]          PATIENT TYPE:  INP   LOCATION:  2920                         FACILITY:  [facility]   PHYSICIAN:  Micheline Chapman, MD   DATE OF BIRTH:  10/09/1955   DATE OF ADMISSION:  12/14/2005  DATE OF DISCHARGE:  12/15/2005                                 DISCHARGE SUMMARY   PRIMARY CARDIOLOGIST:  Dr. Tonny Bollman.   PRIMARY CARE PHYSICIAN:  Dr. Susann Givens.   PRINCIPAL DIAGNOSIS:  Unstable angina/coronary artery disease.   OTHER DIAGNOSES:  1. Hypertension.  2. Hyperlipidemia.  3. Ongoing tobacco abuse.   ALLERGIES:  No known drug allergies.   PROCEDURE:  Left heart cardiac catheterization with successful PCI and  stenting of the mid left circumflex with placement of a 2.5 x 20-mm Taxus  drug-eluting stent.   HISTORY OF PRESENT ILLNESS:  A 55 year old white male with no prior history  of CAD who had sudden onset of substernal chest pressure and tightness  associated with shortness of breath and presyncope on December 14, 2005.  He  was taken via EMS to the [facility] ER where symptoms were relieved with  sublingual nitroglycerin x2. In the ER, he was in sinus tachycardia with a  question of posterior ST/T changes on his ECG.  A decision was made to admit  him for cardiac catheterization.   HOSPITAL COURSE:  George Benson underwent left heart cardiac catheterization  which revealed multivessel disease including a 40% lesion in the mid LAD, a  75% ostial stenosis in the second diagonal, a 60% stenosis in the first  obtuse marginal, a 90% stenosis in the left circumflex, and a 60% stenosis  in the mid right coronary artery. It was felt the circumflex lesion was the  culprit vessel and he underwent successful PCI and stenting with placement  of 2.5 x 20-mm Taxus drug-eluting stent.  He tolerated this procedure well  and postprocedure his cardiac markers have remained negative.  He has been  ambulating without any recurrent chest discomfort and has been counseled on  the importance of smoking cessation.  He has been initiated on aspirin,  Plavix, beta blocker, ARB and statin therapy which up to this point he has  tolerated.  He is being discharged home today in satisfactory condition.   DISCHARGE LABS:  Hemoglobin 14.4, hematocrit 41.8,  WBC 6.0, platelets 201,  MCV 99.5.  Sodium 136, potassium 3.6, chloride 105, CO2 25, BUN 10,  creatinine 0.9, glucose 95. PT 13.2, INR is 1.0, PTT 29, total bilirubin  1.0, alkaline phosphatase 59, AST 21, ALT 19, albumin 3.9.  CK 61, MB 1.3,  troponin I 0.03, total cholesterol 193, triglycerides 89, HDL 52, LDL 123,  calcium 8.8. Hemoglobin A1c is pending. D-dimer was less than 0.22.  TSH  1.469.   DISPOSITION:  The patient is being discharged home today in good condition.   FOLLOW-UP APPOINTMENTS:  He has a follow-up appointment with Dr. Tonny Bollman at [facility] cardiology on August 20 at 9:30 a.m.  He is asked to  followup with his primary care physician,  Dr. Susann Givens, in 3-4 weeks.   DISCHARGE MEDICATIONS:  1. Aspirin 325 mg daily.  2. Plavix 75 mg daily.  3. Lipitor 80 mg q.h.s.  4. Benicar 20 mg daily.  5. Lopressor 25 mg q.12 h.  6. Nitroglycerin 0.4 mg sublingual p.r.n. chest pain.   OUTSTANDING LAB STUDIES:  Hemoglobin A1c is pending.   DURATION DISCHARGE ENCOUNTER:  45 minutes including physician time.      George Anis, NP      Micheline Chapman, MD  Electronically Signed    CRB/MEDQ  D:  12/15/2005  T:  12/15/2005  Job:  [id number]   cc:   Sharlot Gowda, M.D.

## 2010-10-15 ENCOUNTER — Telehealth: Payer: Self-pay | Admitting: Family Medicine

## 2010-10-15 DIAGNOSIS — E7889 Other lipoprotein metabolism disorders: Secondary | ICD-10-CM

## 2010-10-15 MED ORDER — ROSUVASTATIN CALCIUM 20 MG PO TABS: 20.0000 mg | ORAL_TABLET | Freq: Every day | ORAL | Status: DC

## 2010-10-15 NOTE — Telephone Encounter (Signed)
Refilled crestor 

## 2010-10-22 ENCOUNTER — Encounter: Payer: Self-pay | Admitting: Family Medicine

## 2010-10-23 ENCOUNTER — Ambulatory Visit (INDEPENDENT_AMBULATORY_CARE_PROVIDER_SITE_OTHER): Payer: BC Managed Care – PPO | Admitting: Family Medicine

## 2010-10-23 ENCOUNTER — Encounter: Payer: Self-pay | Admitting: Family Medicine

## 2010-10-23 VITALS — BP 134/80 | HR 70 | Wt 162.0 lb

## 2010-10-23 DIAGNOSIS — I10 Essential (primary) hypertension: Secondary | ICD-10-CM

## 2010-10-23 NOTE — Progress Notes (Signed)
  Subjective:    Patient ID: George Benson, male    DOB: 11-03-1955, 55 y.o.   MRN: [id number]  HPI He is here for recheck. He for unknown reasons has not started on Hyzaar. He apparently has been continuing on Benicar. Review of the record indicates that was indeed called him last month.   Review of Systems     Objective:   Physical Exam alert and in no distress. But pressure is recorded.        Assessment & Plan:  Hypertension. He is to start on medication and return here in one month for a recheck.

## 2010-10-23 NOTE — Patient Instructions (Signed)
Take the Hyzaar regularly and return here in one month for rechec

## 2010-11-24 ENCOUNTER — Ambulatory Visit (INDEPENDENT_AMBULATORY_CARE_PROVIDER_SITE_OTHER): Payer: BC Managed Care – PPO | Admitting: Family Medicine

## 2010-11-24 ENCOUNTER — Encounter: Payer: Self-pay | Admitting: Family Medicine

## 2010-11-24 VITALS — BP 138/100 | HR 70 | Wt 159.0 lb

## 2010-11-24 DIAGNOSIS — I10 Essential (primary) hypertension: Secondary | ICD-10-CM

## 2010-11-24 MED ORDER — LOSARTAN POTASSIUM-HCTZ 100-25 MG PO TABS: 1.0000 | ORAL_TABLET | Freq: Every day | ORAL | Status: AC

## 2010-11-24 NOTE — Progress Notes (Signed)
  Subjective:    Patient ID: George Benson, male    DOB: 1955-11-23, 55 y.o.   MRN: [id number]  HPI He is here for blood pressure recheck. He has had no difficulty prominence present medication.   Review of Systems     Objective:   Physical Exam Alert and in no distress. Blood pressure is recorded       Assessment & Plan:  Hypertension Increase Hyzaar to 100/12.5 and recheck here in one month.

## 2010-11-24 NOTE — Patient Instructions (Signed)
Start on the new medicine and I'll check you again in about a month

## 2010-12-21 ENCOUNTER — Ambulatory Visit (INDEPENDENT_AMBULATORY_CARE_PROVIDER_SITE_OTHER): Payer: BC Managed Care – PPO | Admitting: Family Medicine

## 2010-12-21 ENCOUNTER — Encounter: Payer: Self-pay | Admitting: Family Medicine

## 2010-12-21 DIAGNOSIS — R109 Unspecified abdominal pain: Secondary | ICD-10-CM

## 2010-12-21 DIAGNOSIS — F329 Major depressive disorder, single episode, unspecified: Secondary | ICD-10-CM

## 2010-12-21 MED ORDER — BUPROPION HCL ER (SR) 150 MG PO TB12: 150.0000 mg | ORAL_TABLET | Freq: Two times a day (BID) | ORAL | Status: DC

## 2010-12-21 NOTE — Progress Notes (Signed)
  Subjective:    Patient ID: George Benson, male    DOB: 02/15/1956, 55 y.o.   MRN: [id number]  HPI He is here with his wife. Apparently he has had difficulty recently with a nervous stomach. He has had one episode of vomiting. He is also noted weakness as well as sweating. Apparently food does help with this. He is also try to calm. He's seen and no black tarry stools or blood in his vomitus. He has been under a lot of stress due to to cut back in his work load as well as affected his wife no longer has a job. He did say having difficulty with anhedonia as well as extreme fatigue.   Review of Systems     Objective:   Physical Exam Alert and in no distress with a relatively flat affect.       Assessment & Plan:  Symptoms suggestive of depression. I explained this to him and his wife in great detail. I will place him on Wellbutrin. I will start with 1 pill per day. Discussed possible side effects. He will also take Prilosec on a regular basis. He is to return here in 2 weeks. 30 minutes spent discussing this issue with him and his wife.

## 2010-12-21 NOTE — Patient Instructions (Signed)
Take one Wellbutrin per day in the morning for the first 4 or 5 days and then go to twice a day. If you have problems call me 2 Prilosec per day for the stomach  Call me in one week

## 2010-12-28 ENCOUNTER — Ambulatory Visit: Payer: BC Managed Care – PPO | Admitting: Family Medicine

## 2011-01-04 ENCOUNTER — Encounter: Payer: Self-pay | Admitting: Family Medicine

## 2011-01-04 ENCOUNTER — Ambulatory Visit (INDEPENDENT_AMBULATORY_CARE_PROVIDER_SITE_OTHER): Payer: BC Managed Care – PPO | Admitting: Family Medicine

## 2011-01-04 VITALS — BP 118/90 | HR 83 | Wt 155.0 lb

## 2011-01-04 DIAGNOSIS — R5383 Other fatigue: Secondary | ICD-10-CM

## 2011-01-04 DIAGNOSIS — R5381 Other malaise: Secondary | ICD-10-CM

## 2011-01-04 DIAGNOSIS — F329 Major depressive disorder, single episode, unspecified: Secondary | ICD-10-CM

## 2011-01-04 DIAGNOSIS — Z23 Encounter for immunization: Secondary | ICD-10-CM

## 2011-01-04 NOTE — Patient Instructions (Signed)
Take the Wellbutrin at night and let me know how it does before you go two. Continue on the Prilosec

## 2011-01-04 NOTE — Progress Notes (Signed)
  Subjective:    Patient ID: George Benson, male    DOB: February 04, 1956, 55 y.o.   MRN: [id number]  HPI He is here for recheck. He states the Prilosec has helped a lot with his abdominal discomfort. The Wellbutrin caused difficulty with dizziness. For several hours after taking the medication this would cause the dizziness making it difficult for him to function. He continues to have difficulty with fatigue. This sometimes makes it difficult for him to do anything past the working day.   Review of Systems     Objective:   Physical Exam Alert and in no distress otherwise not examined      Assessment & Plan:  Dysthymia. Fatigue I will check TSH and testosterone. Recommend he try taking the Wellbutrin at night and then increase it after several days if he has no difficulty.

## 2011-01-05 ENCOUNTER — Telehealth: Payer: Self-pay

## 2011-01-05 LAB — TESTOSTERONE: Testosterone: 286.19 ng/dL (ref 250–890)

## 2011-01-05 NOTE — Telephone Encounter (Signed)
Called pt to inform him of labs left message that tsh normal test. Low make nurse visit for a morning to have testosterone recheck

## 2011-01-06 ENCOUNTER — Telehealth: Payer: Self-pay

## 2011-01-06 ENCOUNTER — Institutional Professional Consult (permissible substitution): Payer: BC Managed Care – PPO | Admitting: Family Medicine

## 2011-01-06 NOTE — Telephone Encounter (Signed)
Called pt to let him know that he needs a nurse visit for a morning blood draw to check testosterone

## 2011-01-06 NOTE — Telephone Encounter (Signed)
Talked with wife and let her know nurse visit Thursday morning

## 2011-01-07 ENCOUNTER — Other Ambulatory Visit: Payer: BC Managed Care – PPO

## 2011-01-12 ENCOUNTER — Telehealth: Payer: Self-pay

## 2011-01-12 NOTE — Telephone Encounter (Signed)
LEFT PT MESSAGE TO MAKE AN APPT TO COME IN AND DISCUSSE LABS SAID NOT TODAY BUT BY FRIDAY

## 2011-01-15 ENCOUNTER — Ambulatory Visit (INDEPENDENT_AMBULATORY_CARE_PROVIDER_SITE_OTHER): Payer: BC Managed Care – PPO | Admitting: Family Medicine

## 2011-01-15 DIAGNOSIS — E785 Hyperlipidemia, unspecified: Secondary | ICD-10-CM

## 2011-01-15 DIAGNOSIS — E291 Testicular hypofunction: Secondary | ICD-10-CM

## 2011-01-15 MED ORDER — ROSUVASTATIN CALCIUM 40 MG PO TABS: 40.0000 mg | ORAL_TABLET | Freq: Every day | ORAL | Status: AC

## 2011-01-15 MED ORDER — COLESEVELAM HCL 3.75 G PO PACK: 1.0000 | PACK | ORAL | Status: DC

## 2011-01-15 MED ORDER — TESTOSTERONE 50 MG/5GM (1%) TD GEL: 5.0000 g | Freq: Every day | TRANSDERMAL | Status: DC

## 2011-01-15 NOTE — Progress Notes (Signed)
  Subjective:    Patient ID: George Benson, male    DOB: 1955/08/27, 55 y.o.   MRN: [id number]  HPI He is here for recheck. He states he does take his Crestor on a regular basis. His eating habits have not changed.  He also continues to have difficulty with energy and stamina. Recent testosterone levels were low. He also notes that using the bupropion at night has helped with his dizziness. He will start state both of these at night.   Review of Systems     Objective:   Physical Exam Alert and in no distress otherwise not examined       Assessment & Plan:  No diagnosis found. Hypogonadism. Hyperlipidemia. I will add WelChol for his regimen and start him on testosterone. Recheck here in 2 months.

## 2011-01-17 MED ORDER — TESTOSTERONE 50 MG/5GM (1%) TD GEL: 5.0000 g | Freq: Every day | TRANSDERMAL | Status: AC

## 2011-01-17 NOTE — Progress Notes (Signed)
Addended by: Ronnald Nian on: 01/17/2011 10:28 AM   Modules accepted: Orders

## 2011-01-17 NOTE — Progress Notes (Signed)
  Subjective:    Patient ID: George Benson, male    DOB: 04-Dec-1955, 55 y.o.   MRN: [id number]  HPI Testim emailed. Will also stop the Welchol and retest in 2 months.    Review of Systems     Objective:   Physical Exam        Assessment & Plan:

## 2011-01-18 ENCOUNTER — Telehealth: Payer: Self-pay | Admitting: Family Medicine

## 2011-01-18 NOTE — Telephone Encounter (Signed)
[facility] called, pt wife there to pick up rx testim, they do not have rx, gave rx information to pharmacist per chart

## 2011-01-19 ENCOUNTER — Emergency Department (HOSPITAL_COMMUNITY): Payer: BC Managed Care – PPO

## 2011-01-19 ENCOUNTER — Encounter: Payer: Self-pay | Admitting: Family Medicine

## 2011-01-19 ENCOUNTER — Telehealth: Payer: Self-pay | Admitting: Family Medicine

## 2011-01-19 ENCOUNTER — Ambulatory Visit (INDEPENDENT_AMBULATORY_CARE_PROVIDER_SITE_OTHER): Payer: BC Managed Care – PPO | Admitting: Family Medicine

## 2011-01-19 ENCOUNTER — Inpatient Hospital Stay (HOSPITAL_COMMUNITY)
Admission: EM | Admit: 2011-01-19 | Discharge: 2011-01-21 | Disposition: A | Discharge status: Hospice | Payer: BC Managed Care – PPO | Source: Home / Self Care | Attending: Internal Medicine | Admitting: Internal Medicine

## 2011-01-19 VITALS — BP 116/68 | HR 64 | Ht 72.0 in | Wt 148.0 lb

## 2011-01-19 DIAGNOSIS — R112 Nausea with vomiting, unspecified: Secondary | ICD-10-CM

## 2011-01-19 LAB — CBC WITH DIFFERENTIAL/PLATELET
Basophils Relative: 0 % (ref 0–1)
Eosinophils Absolute: 0 10*3/uL (ref 0.0–0.7)
Eosinophils Relative: 0 % (ref 0–5)
Hemoglobin: 13.5 g/dL (ref 13.0–17.0)
Lymphs Abs: 0.7 10*3/uL (ref 0.7–4.0)
MCH: 33.8 pg (ref 26.0–34.0)
MCHC: 35 g/dL (ref 30.0–36.0)
MCV: 96.5 fL (ref 78.0–100.0)
Monocytes Absolute: 0.4 10*3/uL (ref 0.1–1.0)
Monocytes Relative: 6 % (ref 3–12)
RBC: 4 MIL/uL — ABNORMAL LOW (ref 4.22–5.81)

## 2011-01-19 LAB — ETHANOL
Alcohol, Ethyl (B): <10 mg/dL (ref 0–10)
Alcohol, Ethyl (B): <11 mg/dL (ref 0–11)

## 2011-01-19 LAB — CBC
Hemoglobin: 13.7 g/dL (ref 13.0–17.0)
Platelets: 93 10*3/uL — ABNORMAL LOW (ref 150–400)
RBC: 4.07 MIL/uL — ABNORMAL LOW (ref 4.22–5.81)
WBC: 9.1 10*3/uL (ref 4.0–10.5)

## 2011-01-19 LAB — BASIC METABOLIC PANEL
BUN: 40 mg/dL — ABNORMAL HIGH (ref 6–23)
CO2: 16 mEq/L — ABNORMAL LOW (ref 19–32)
Calcium: 8.3 mg/dL — ABNORMAL LOW (ref 8.4–10.5)
Creat: 6.71 mg/dL — ABNORMAL HIGH (ref 0.50–1.35)
Glucose, Bld: 139 mg/dL — ABNORMAL HIGH (ref 70–99)

## 2011-01-19 LAB — COMPREHENSIVE METABOLIC PANEL
AST: 1586 U/L — ABNORMAL HIGH (ref 0–37)
BUN: 39 mg/dL — ABNORMAL HIGH (ref 6–23)
CO2: 17 mEq/L — ABNORMAL LOW (ref 19–32)
Calcium: 8.7 mg/dL (ref 8.4–10.5)
Chloride: 91 mEq/L — ABNORMAL LOW (ref 96–112)
Creatinine, Ser: 7.44 mg/dL — ABNORMAL HIGH (ref 0.50–1.35)
GFR calc Af Amer: 9 mL/min — ABNORMAL LOW (ref >60)
GFR calc non Af Amer: 8 mL/min — ABNORMAL LOW (ref >60)
Total Bilirubin: 0.8 mg/dL (ref 0.3–1.2)

## 2011-01-19 NOTE — Progress Notes (Signed)
  Subjective:    Patient ID: George Benson, male    DOB: 05/19/1955, 55 y.o.   MRN: [id number]  HPI He is here as an acute unscheduled visit for evaluation of a three-day history that started with nausea, vomiting. He also has had some postnasal drainage that he thinks is contributing to his nausea vomiting. He also complains of some dizziness and feeling shaky. Yesterday he developed a sore throat and today hoarse voice. He has had difficulty sleeping and apparently last night describes what could be hallucinations and bright lights and sensations of something crawling on his legs. He has a previous history of alcohol abuse. He does continue to have difficulty with weight loss. He was recently seen and his antidepressant medications were readjusted.   Review of Systems     Objective:   Physical Exam alert and in no distress. Tympanic membranes and canals are normal. Throat is clear. Tonsils are normal. Neck is supple without adenopathy or thyromegaly. Cardiac exam shows a regular sinus rhythm without murmurs or gallops. Lungs are clear to auscultation. Abdominal exam showed decreased bowel sounds without masses or tenderness.        Assessment & Plan:   1. Nausea and vomiting in adult  CBC with Differential, Basic Metabolic Panel (BMET), Amylase, Lipase, Ethanol   The blood work came back showing multiple abnormalities including elevated BUN and creatinine. He will be sent to [facility] long for further evaluation and probable admission.

## 2011-01-19 NOTE — Telephone Encounter (Signed)
APPT SCHEDULED

## 2011-01-20 LAB — COMPREHENSIVE METABOLIC PANEL
ALT: 333 U/L — ABNORMAL HIGH (ref 0–53)
AST: 764 U/L — ABNORMAL HIGH (ref 0–37)
Albumin: 1.8 g/dL — ABNORMAL LOW (ref 3.5–5.2)
Alkaline Phosphatase: 282 U/L — ABNORMAL HIGH (ref 39–117)
BUN: 36 mg/dL — ABNORMAL HIGH (ref 6–23)
CO2: 16 mEq/L — ABNORMAL LOW (ref 19–32)
Calcium: 6 mg/dL — CL (ref 8.4–10.5)
Chloride: 107 mEq/L (ref 96–112)
Creatinine, Ser: 7.67 mg/dL — ABNORMAL HIGH (ref 0.50–1.35)
GFR calc Af Amer: 9 mL/min — ABNORMAL LOW (ref >60)
GFR calc non Af Amer: 7 mL/min — ABNORMAL LOW (ref >60)
Glucose, Bld: 90 mg/dL (ref 70–99)
Potassium: 3.2 mEq/L — ABNORMAL LOW (ref 3.5–5.1)
Sodium: 135 mEq/L (ref 135–145)
Total Bilirubin: 0.6 mg/dL (ref 0.3–1.2)
Total Protein: 4.7 g/dL — ABNORMAL LOW (ref 6.0–8.3)

## 2011-01-20 LAB — CBC
HCT: 31.2 % — ABNORMAL LOW (ref 39.0–52.0)
Hemoglobin: 10.8 g/dL — ABNORMAL LOW (ref 13.0–17.0)
MCH: 33.8 pg (ref 26.0–34.0)
MCHC: 34.6 g/dL (ref 30.0–36.0)
MCV: 97.5 fL (ref 78.0–100.0)
Platelets: 75 10*3/uL — ABNORMAL LOW (ref 150–400)
RBC: 3.2 MIL/uL — ABNORMAL LOW (ref 4.22–5.81)
RDW: 13.7 % (ref 11.5–15.5)
WBC: 7.1 10*3/uL (ref 4.0–10.5)

## 2011-01-20 LAB — URINALYSIS, ROUTINE W REFLEX MICROSCOPIC
Nitrite: NEGATIVE
Protein, ur: >300 mg/dL — AB
Urobilinogen, UA: 0.2 mg/dL (ref 0.0–1.0)
pH: 5.5 (ref 5.0–8.0)

## 2011-01-20 LAB — URINE MICROSCOPIC-ADD ON

## 2011-01-20 LAB — MRSA PCR SCREENING: MRSA by PCR: NEGATIVE

## 2011-01-21 ENCOUNTER — Inpatient Hospital Stay (HOSPITAL_COMMUNITY): Payer: BC Managed Care – PPO

## 2011-01-21 ENCOUNTER — Inpatient Hospital Stay (HOSPITAL_COMMUNITY)
Admission: AD | Admit: 2011-01-21 | Discharge: 2011-02-18 | DRG: 568 | Disposition: A | Discharge status: Home Health | Payer: BC Managed Care – PPO | Source: Ambulatory Visit | Attending: Nephrology | Admitting: Nephrology

## 2011-01-21 ENCOUNTER — Telehealth: Payer: Self-pay | Admitting: Family Medicine

## 2011-01-21 DIAGNOSIS — I2510 Atherosclerotic heart disease of native coronary artery without angina pectoris: Secondary | ICD-10-CM | POA: Diagnosis present

## 2011-01-21 DIAGNOSIS — D638 Anemia in other chronic diseases classified elsewhere: Secondary | ICD-10-CM | POA: Diagnosis present

## 2011-01-21 DIAGNOSIS — E871 Hypo-osmolality and hyponatremia: Secondary | ICD-10-CM | POA: Diagnosis present

## 2011-01-21 DIAGNOSIS — F1020 Alcohol dependence, uncomplicated: Secondary | ICD-10-CM | POA: Diagnosis present

## 2011-01-21 DIAGNOSIS — E876 Hypokalemia: Secondary | ICD-10-CM | POA: Diagnosis present

## 2011-01-21 DIAGNOSIS — E785 Hyperlipidemia, unspecified: Secondary | ICD-10-CM | POA: Diagnosis present

## 2011-01-21 DIAGNOSIS — E872 Acidosis, unspecified: Secondary | ICD-10-CM | POA: Diagnosis present

## 2011-01-21 DIAGNOSIS — D61818 Other pancytopenia: Secondary | ICD-10-CM | POA: Diagnosis present

## 2011-01-21 DIAGNOSIS — F10931 Alcohol use, unspecified with withdrawal delirium: Secondary | ICD-10-CM | POA: Diagnosis present

## 2011-01-21 DIAGNOSIS — J690 Pneumonitis due to inhalation of food and vomit: Secondary | ICD-10-CM | POA: Diagnosis present

## 2011-01-21 DIAGNOSIS — K219 Gastro-esophageal reflux disease without esophagitis: Secondary | ICD-10-CM | POA: Diagnosis present

## 2011-01-21 DIAGNOSIS — F10231 Alcohol dependence with withdrawal delirium: Secondary | ICD-10-CM | POA: Diagnosis present

## 2011-01-21 DIAGNOSIS — R55 Syncope and collapse: Secondary | ICD-10-CM | POA: Diagnosis not present

## 2011-01-21 DIAGNOSIS — D696 Thrombocytopenia, unspecified: Secondary | ICD-10-CM | POA: Diagnosis present

## 2011-01-21 DIAGNOSIS — K7010 Alcoholic hepatitis without ascites: Secondary | ICD-10-CM | POA: Diagnosis present

## 2011-01-21 DIAGNOSIS — N170 Acute kidney failure with tubular necrosis: Principal | ICD-10-CM | POA: Diagnosis present

## 2011-01-21 DIAGNOSIS — E8779 Other fluid overload: Secondary | ICD-10-CM | POA: Diagnosis present

## 2011-01-21 DIAGNOSIS — R000 Tachycardia, unspecified: Secondary | ICD-10-CM | POA: Diagnosis not present

## 2011-01-21 DIAGNOSIS — I959 Hypotension, unspecified: Secondary | ICD-10-CM | POA: Diagnosis present

## 2011-01-21 DIAGNOSIS — N186 End stage renal disease: Secondary | ICD-10-CM | POA: Diagnosis not present

## 2011-01-21 DIAGNOSIS — Z9861 Coronary angioplasty status: Secondary | ICD-10-CM

## 2011-01-21 DIAGNOSIS — I120 Hypertensive chronic kidney disease with stage 5 chronic kidney disease or end stage renal disease: Secondary | ICD-10-CM | POA: Diagnosis present

## 2011-01-21 LAB — BLOOD GAS, ARTERIAL
Bicarbonate: 10.8 mEq/L — ABNORMAL LOW (ref 20.0–24.0)
pCO2 arterial: 26 mmHg — ABNORMAL LOW (ref 35.0–45.0)
pH, Arterial: 7.242 — ABNORMAL LOW (ref 7.350–7.450)
pO2, Arterial: 70 mmHg — ABNORMAL LOW (ref 80.0–100.0)

## 2011-01-21 LAB — COMPREHENSIVE METABOLIC PANEL
ALT: 220 U/L — ABNORMAL HIGH (ref 0–53)
AST: 418 U/L — ABNORMAL HIGH (ref 0–37)
Alkaline Phosphatase: 299 U/L — ABNORMAL HIGH (ref 39–117)
CO2: 17 mEq/L — ABNORMAL LOW (ref 19–32)
GFR calc Af Amer: 7 mL/min — ABNORMAL LOW (ref >60)
GFR calc non Af Amer: 6 mL/min — ABNORMAL LOW (ref >60)
Glucose, Bld: 98 mg/dL (ref 70–99)
Potassium: 4.8 mEq/L (ref 3.5–5.1)
Sodium: 139 mEq/L (ref 135–145)
Total Protein: 4.4 g/dL — ABNORMAL LOW (ref 6.0–8.3)

## 2011-01-21 LAB — CK: Total CK: 896 U/L — ABNORMAL HIGH (ref 7–232)

## 2011-01-21 LAB — DIFFERENTIAL
Eosinophils Relative: 1 % (ref 0–5)
Lymphs Abs: 0.9 10*3/uL (ref 0.7–4.0)
Monocytes Absolute: 0.8 10*3/uL (ref 0.1–1.0)
Neutro Abs: 4.7 10*3/uL (ref 1.7–7.7)

## 2011-01-21 LAB — CBC
Hemoglobin: 10 g/dL — ABNORMAL LOW (ref 13.0–17.0)
Hemoglobin: 10.5 g/dL — ABNORMAL LOW (ref 13.0–17.0)
MCH: 34.1 pg — ABNORMAL HIGH (ref 26.0–34.0)
MCHC: 35.1 g/dL (ref 30.0–36.0)
MCV: 97.1 fL (ref 78.0–100.0)
RBC: 2.95 MIL/uL — ABNORMAL LOW (ref 4.22–5.81)

## 2011-01-21 NOTE — Telephone Encounter (Signed)
[facility] INFORMED-LM

## 2011-01-22 ENCOUNTER — Inpatient Hospital Stay (HOSPITAL_COMMUNITY): Payer: BC Managed Care – PPO

## 2011-01-22 LAB — RENAL FUNCTION PANEL
Albumin: 1.7 g/dL — ABNORMAL LOW (ref 3.5–5.2)
BUN: 53 mg/dL — ABNORMAL HIGH (ref 6–23)
Calcium: 6.5 mg/dL — ABNORMAL LOW (ref 8.4–10.5)
Glucose, Bld: 110 mg/dL — ABNORMAL HIGH (ref 70–99)
Phosphorus: 4.8 mg/dL — ABNORMAL HIGH (ref 2.3–4.6)
Potassium: 4.5 mEq/L (ref 3.5–5.1)
Sodium: 139 mEq/L (ref 135–145)

## 2011-01-22 LAB — COMPREHENSIVE METABOLIC PANEL
Alkaline Phosphatase: 324 U/L — ABNORMAL HIGH (ref 39–117)
BUN: 50 mg/dL — ABNORMAL HIGH (ref 6–23)
CO2: 23 mEq/L (ref 19–32)
Chloride: 102 mEq/L (ref 96–112)
Creatinine, Ser: 10.79 mg/dL — ABNORMAL HIGH (ref 0.50–1.35)
GFR calc non Af Amer: 5 mL/min — ABNORMAL LOW (ref >60)
Glucose, Bld: 82 mg/dL (ref 70–99)
Potassium: 3.8 mEq/L (ref 3.5–5.1)
Total Bilirubin: 0.6 mg/dL (ref 0.3–1.2)

## 2011-01-22 LAB — HEPATITIS B SURFACE ANTIGEN: Hepatitis B Surface Ag: NEGATIVE

## 2011-01-22 LAB — CREATININE, URINE, RANDOM: Creatinine, Urine: 109.79 mg/dL

## 2011-01-22 LAB — CBC
HCT: 30 % — ABNORMAL LOW (ref 39.0–52.0)
Hemoglobin: 10.3 g/dL — ABNORMAL LOW (ref 13.0–17.0)
MCH: 33.1 pg (ref 26.0–34.0)
MCHC: 34.3 g/dL (ref 30.0–36.0)
MCV: 96.5 fL (ref 78.0–100.0)
RDW: 13.7 % (ref 11.5–15.5)

## 2011-01-22 LAB — HEPATITIS PANEL, ACUTE: Hep B C IgM: NEGATIVE

## 2011-01-22 LAB — SODIUM, URINE, RANDOM: Sodium, Ur: 82 mEq/L

## 2011-01-22 LAB — AMYLASE: Amylase: 62 U/L (ref 0–105)

## 2011-01-22 LAB — PHOSPHORUS: Phosphorus: 4.1 mg/dL (ref 2.3–4.6)

## 2011-01-22 LAB — PROTIME-INR: INR: 1.12 (ref 0.00–1.49)

## 2011-01-22 LAB — IRON AND TIBC: UIBC: 74 ug/dL — ABNORMAL LOW (ref 125–400)

## 2011-01-22 LAB — CK TOTAL AND CKMB (NOT AT ARMC): Total CK: 795 U/L — ABNORMAL HIGH (ref 7–232)

## 2011-01-23 ENCOUNTER — Inpatient Hospital Stay (HOSPITAL_COMMUNITY): Payer: BC Managed Care – PPO

## 2011-01-23 LAB — COMPREHENSIVE METABOLIC PANEL
Alkaline Phosphatase: 364 U/L — ABNORMAL HIGH (ref 39–117)
BUN: 43 mg/dL — ABNORMAL HIGH (ref 6–23)
Calcium: 7.6 mg/dL — ABNORMAL LOW (ref 8.4–10.5)
Creatinine, Ser: 9.93 mg/dL — ABNORMAL HIGH (ref 0.50–1.35)
GFR calc Af Amer: 7 mL/min — ABNORMAL LOW (ref >60)
Glucose, Bld: 96 mg/dL (ref 70–99)
Total Protein: 5.3 g/dL — ABNORMAL LOW (ref 6.0–8.3)

## 2011-01-23 LAB — BLOOD GAS, ARTERIAL
Drawn by: 34779
Patient temperature: 98.6
pCO2 arterial: 42.7 mmHg (ref 35.0–45.0)
pH, Arterial: 7.373 (ref 7.350–7.450)

## 2011-01-23 LAB — C3 COMPLEMENT: C3 Complement: 120 mg/dL (ref 90–180)

## 2011-01-23 LAB — HEPATITIS B SURFACE ANTIBODY,QUALITATIVE: Hep B S Ab: NEGATIVE

## 2011-01-23 LAB — PHOSPHORUS: Phosphorus: 5.4 mg/dL — ABNORMAL HIGH (ref 2.3–4.6)

## 2011-01-24 LAB — DIFFERENTIAL
Basophils Absolute: 0 10*3/uL (ref 0.0–0.1)
Basophils Relative: 0 % (ref 0–1)
Eosinophils Absolute: 0.2 10*3/uL (ref 0.0–0.7)
Monocytes Relative: 23 % — ABNORMAL HIGH (ref 3–12)
Neutrophils Relative %: 60 % (ref 43–77)

## 2011-01-24 LAB — RENAL FUNCTION PANEL
CO2: 24 mEq/L (ref 19–32)
Calcium: 7.8 mg/dL — ABNORMAL LOW (ref 8.4–10.5)
GFR calc Af Amer: 7 mL/min — ABNORMAL LOW (ref >60)
GFR calc non Af Amer: 6 mL/min — ABNORMAL LOW (ref >60)
Phosphorus: 4.5 mg/dL (ref 2.3–4.6)
Sodium: 135 mEq/L (ref 135–145)

## 2011-01-24 LAB — CBC
MCH: 33.9 pg (ref 26.0–34.0)
MCHC: 34.7 g/dL (ref 30.0–36.0)
Platelets: 66 10*3/uL — ABNORMAL LOW (ref 150–400)
RBC: 3.1 MIL/uL — ABNORMAL LOW (ref 4.22–5.81)

## 2011-01-25 ENCOUNTER — Inpatient Hospital Stay (HOSPITAL_COMMUNITY): Payer: BC Managed Care – PPO

## 2011-01-25 LAB — COMPREHENSIVE METABOLIC PANEL
ALT: 117 U/L — ABNORMAL HIGH (ref 0–53)
Albumin: 1.4 g/dL — ABNORMAL LOW (ref 3.5–5.2)
Alkaline Phosphatase: 403 U/L — ABNORMAL HIGH (ref 39–117)
Potassium: 3.9 mEq/L (ref 3.5–5.1)
Sodium: 134 mEq/L — ABNORMAL LOW (ref 135–145)
Total Protein: 4.7 g/dL — ABNORMAL LOW (ref 6.0–8.3)

## 2011-01-25 LAB — PTH, INTACT AND CALCIUM: Calcium, Total (PTH): 6.8 mg/dL — ABNORMAL LOW (ref 8.4–10.5)

## 2011-01-25 LAB — CBC
MCHC: 33.7 g/dL (ref 30.0–36.0)
RDW: 13.2 % (ref 11.5–15.5)

## 2011-01-25 NOTE — Consult Note (Signed)
  NAMECAVON, George Benson            ACCOUNT NO.:  [id number]  MEDICAL RECORD NO.:  [id number]  LOCATION:  1241                         FACILITY:  [facility]  PHYSICIAN:  Mindi Slicker. Lowell Guitar, M.D.  DATE OF BIRTH:  [birth year]  DATE OF CONSULTATION: DATE OF DISCHARGE:                                CONSULTATION   I was asked by Dr. Jomarie Longs to see this 55 year old male with acute kidney injury.  Patient has a history of alcoholism and hypertension.  Patient reportedly had nausea and vomiting over the past 4 days and some falls because of dizziness.  Presented to the emergency room, blood pressure of 87/59, given intravenous fluids.  Serum creatinine was 7.44, bicarb 17, was previously on an ACE inhibitor prior to admission as well as diuretic.  Patient has developed persistent oliguria, metabolic acidosis, and Renal consultation has been requested.  PAST HISTORY:  Remarkable for: 1. Alcoholism. 2. Dyslipidemia. 3. Hypertension. 4. GERD. 5. Coronary artery disease, status post percutaneous intervention with     drug-eluting stent in 2007.  SOCIAL HISTORY:  Married for the second time, alcoholic, and smoker.  CURRENT MEDICATIONS:  Include folic acid, Ativan, Protonix, thiamine, and albuterol in the hospital.  FAMILY HISTORY:  Remarkable for alcoholism and cirrhosis.  REVIEW OF SYSTEMS:  He is currently agitated.  PHYSICAL EXAMINATION:  VITAL SIGNS:  Blood pressure is 97/64, heart rate 94.  I's and O's; 2608 mL in, 30 mL out in the last 24 hours. GENERAL:  Pleasant Caucasian male, well nourished, alert, and oriented x3. HEENT:  Atraumatic, normocephalic.  Extraocular movements are intact. Speech is fluent and appropriate. LUNGS:  Clear. HEART:  Regular rhythm and rate. ABDOMEN:  Soft. EXTREMITIES:  No edema. SKIN:  Turgor is adequate.  There is some mottling of the knees. NEUROLOGIC:  No focality.  Patient's speech is fluent, but slow and he is appropriate.  Urinalysis; specific  gravity 1.024, pH 5.5, 0-2 red blood cells, 0-2 white blood cells, greater than 300 mg/dL protein.  Sodium 135, potassium 3.2, chloride 107, CO2 16, BUN 36, creatinine 7.67, albumin 1.8, calcium 6.0, hemoglobin 10.8, SGOT 764, SGPT 333, bilirubin 0.3. Ultrasound of the kidneys; right kidney 11.1, left kidney 11.2 cm.  No hydronephrosis.  ASSESSMENT: 1. Oliguric acute renal failure hemodynamically mediated with     combination of gastrointestinal losses, shock, and ACE inhibitor     therapy. 2. Proteinuria of uncertain significance. 3. Hypokalemia.  PLAN AND RECOMMENDATIONS: 1. Re-expand intravascular volume as you are doing.  We will switch to     sodium bicarbonate to help the acidosis. 2. Potassium replaced and we will follow with you.          ______________________________ Mindi Slicker. Lowell Guitar, M.D.     ACP/MEDQ  D:  01/20/2011  T:  01/21/2011  Job:  [id number]  Electronically Signed by Casimiro Needle M.D. on 01/25/2011 04:43:41 PM

## 2011-01-26 ENCOUNTER — Inpatient Hospital Stay (HOSPITAL_COMMUNITY): Payer: BC Managed Care – PPO

## 2011-01-26 LAB — CBC
Hemoglobin: 8 g/dL — ABNORMAL LOW (ref 13.0–17.0)
MCH: 34 pg (ref 26.0–34.0)
RBC: 2.35 MIL/uL — ABNORMAL LOW (ref 4.22–5.81)
WBC: 6.6 10*3/uL (ref 4.0–10.5)

## 2011-01-26 LAB — PHOSPHORUS: Phosphorus: 4 mg/dL (ref 2.3–4.6)

## 2011-01-26 LAB — BASIC METABOLIC PANEL
CO2: 25 mEq/L (ref 19–32)
GFR calc non Af Amer: 6 mL/min — ABNORMAL LOW (ref >60)
Glucose, Bld: 97 mg/dL (ref 70–99)
Potassium: 3.5 mEq/L (ref 3.5–5.1)
Sodium: 135 mEq/L (ref 135–145)

## 2011-01-26 LAB — ALBUMIN: Albumin: 1.4 g/dL — ABNORMAL LOW (ref 3.5–5.2)

## 2011-01-27 LAB — RENAL FUNCTION PANEL
Albumin: 1.7 g/dL — ABNORMAL LOW (ref 3.5–5.2)
Calcium: 8.5 mg/dL (ref 8.4–10.5)
GFR calc Af Amer: 9 mL/min — ABNORMAL LOW (ref >60)
GFR calc non Af Amer: 7 mL/min — ABNORMAL LOW (ref >60)
Phosphorus: 4.4 mg/dL (ref 2.3–4.6)
Potassium: 4.4 mEq/L (ref 3.5–5.1)
Sodium: 137 mEq/L (ref 135–145)

## 2011-01-27 LAB — CBC
Hemoglobin: 8.3 g/dL — ABNORMAL LOW (ref 13.0–17.0)
MCH: 33.7 pg (ref 26.0–34.0)
MCHC: 33.6 g/dL (ref 30.0–36.0)
Platelets: 52 10*3/uL — ABNORMAL LOW (ref 150–400)
RDW: 13.7 % (ref 11.5–15.5)

## 2011-01-28 ENCOUNTER — Inpatient Hospital Stay (HOSPITAL_COMMUNITY): Payer: BC Managed Care – PPO

## 2011-01-28 LAB — PROTEIN ELECTROPH W RFLX QUANT IMMUNOGLOBULINS
Alpha-1-Globulin: 11.3 % — ABNORMAL HIGH (ref 2.9–4.9)
Alpha-2-Globulin: 17.4 % — ABNORMAL HIGH (ref 7.1–11.8)
M-Spike, %: NOT DETECTED g/dL
Total Protein ELP: 4.3 g/dL — ABNORMAL LOW (ref 6.0–8.3)

## 2011-01-28 LAB — RENAL FUNCTION PANEL
Albumin: 1.7 g/dL — ABNORMAL LOW (ref 3.5–5.2)
Chloride: 91 mEq/L — ABNORMAL LOW (ref 96–112)
GFR calc non Af Amer: 6 mL/min — ABNORMAL LOW (ref >60)
Phosphorus: 4.6 mg/dL (ref 2.3–4.6)
Potassium: 3.8 mEq/L (ref 3.5–5.1)

## 2011-01-28 LAB — CBC
MCHC: 33.8 g/dL (ref 30.0–36.0)
Platelets: 112 10*3/uL — ABNORMAL LOW (ref 150–400)
RDW: 13.7 % (ref 11.5–15.5)
WBC: 8.6 10*3/uL (ref 4.0–10.5)

## 2011-01-29 LAB — BASIC METABOLIC PANEL
BUN: 28 mg/dL — ABNORMAL HIGH (ref 6–23)
Calcium: 8.7 mg/dL (ref 8.4–10.5)
GFR calc Af Amer: 10 mL/min — ABNORMAL LOW (ref >60)
GFR calc non Af Amer: 8 mL/min — ABNORMAL LOW (ref >60)
Glucose, Bld: 104 mg/dL — ABNORMAL HIGH (ref 70–99)
Potassium: 3.8 mEq/L (ref 3.5–5.1)
Sodium: 131 mEq/L — ABNORMAL LOW (ref 135–145)

## 2011-01-29 LAB — PROTEIN, URINE, RANDOM: Total Protein, Urine: 166 mg/dL

## 2011-01-30 ENCOUNTER — Inpatient Hospital Stay (HOSPITAL_COMMUNITY): Payer: BC Managed Care – PPO

## 2011-01-30 LAB — RENAL FUNCTION PANEL
BUN: 31 mg/dL — ABNORMAL HIGH (ref 6–23)
Chloride: 92 mEq/L — ABNORMAL LOW (ref 96–112)
Creatinine, Ser: 8.86 mg/dL — ABNORMAL HIGH (ref 0.50–1.35)
Glucose, Bld: 96 mg/dL (ref 70–99)
Potassium: 3.8 mEq/L (ref 3.5–5.1)

## 2011-01-30 LAB — CBC
HCT: 23.4 % — ABNORMAL LOW (ref 39.0–52.0)
MCHC: 34.2 g/dL (ref 30.0–36.0)
MCV: 100.4 fL — ABNORMAL HIGH (ref 78.0–100.0)
Platelets: 152 10*3/uL (ref 150–400)
RDW: 14.1 % (ref 11.5–15.5)
WBC: 9.4 10*3/uL (ref 4.0–10.5)

## 2011-01-30 LAB — IRON AND TIBC
Iron: 61 ug/dL (ref 42–135)
TIBC: 221 ug/dL (ref 215–435)

## 2011-01-30 LAB — CARDIAC PANEL(CRET KIN+CKTOT+MB+TROPI)
CK, MB: 6.7 ng/mL (ref 0.3–4.0)
Relative Index: 1.3 (ref 0.0–2.5)
Relative Index: 1.6 (ref 0.0–2.5)
Total CK: 521 U/L — ABNORMAL HIGH (ref 7–232)
Troponin I: <0.3 ng/mL (ref <0.30)
Troponin I: <0.3 ng/mL (ref <0.30)
Troponin I: <0.3 ng/mL (ref <0.30)

## 2011-01-31 ENCOUNTER — Inpatient Hospital Stay (HOSPITAL_COMMUNITY): Payer: BC Managed Care – PPO

## 2011-01-31 LAB — BLOOD GAS, ARTERIAL
Acid-base deficit: 0.8 mmol/L (ref 0.0–2.0)
Drawn by: 252031
FIO2: 1 %
pCO2 arterial: 29.7 mmHg — ABNORMAL LOW (ref 35.0–45.0)
pO2, Arterial: 103 mmHg — ABNORMAL HIGH (ref 80.0–100.0)

## 2011-01-31 LAB — CBC
HCT: 23.2 % — ABNORMAL LOW (ref 39.0–52.0)
Hemoglobin: 7.7 g/dL — ABNORMAL LOW (ref 13.0–17.0)
MCHC: 33.2 g/dL (ref 30.0–36.0)
RBC: 2.28 MIL/uL — ABNORMAL LOW (ref 4.22–5.81)
WBC: 10.2 10*3/uL (ref 4.0–10.5)

## 2011-01-31 LAB — BASIC METABOLIC PANEL
Chloride: 98 mEq/L (ref 96–112)
GFR calc non Af Amer: 9 mL/min — ABNORMAL LOW (ref >60)
Glucose, Bld: 111 mg/dL — ABNORMAL HIGH (ref 70–99)
Potassium: 4.4 mEq/L (ref 3.5–5.1)
Sodium: 134 mEq/L — ABNORMAL LOW (ref 135–145)

## 2011-01-31 LAB — MRSA PCR SCREENING: MRSA by PCR: NEGATIVE

## 2011-01-31 LAB — ABO/RH: ABO/RH(D): O POS

## 2011-02-01 ENCOUNTER — Inpatient Hospital Stay (HOSPITAL_COMMUNITY): Payer: BC Managed Care – PPO

## 2011-02-01 LAB — CARDIAC PANEL(CRET KIN+CKTOT+MB+TROPI)
CK, MB: 2.4 ng/mL (ref 0.3–4.0)
CK, MB: 2.5 ng/mL (ref 0.3–4.0)
CK, MB: 2.6 ng/mL (ref 0.3–4.0)
Relative Index: INVALID (ref 0.0–2.5)
Total CK: 68 U/L (ref 7–232)
Total CK: 97 U/L (ref 7–232)
Troponin I: <0.3 ng/mL (ref <0.30)

## 2011-02-01 LAB — URINALYSIS, ROUTINE W REFLEX MICROSCOPIC
Glucose, UA: 100 mg/dL — AB
Leukocytes, UA: NEGATIVE
Nitrite: NEGATIVE
Protein, ur: 100 mg/dL — AB
pH: 7.5 (ref 5.0–8.0)

## 2011-02-01 LAB — URINE MICROSCOPIC-ADD ON

## 2011-02-01 LAB — CBC
Platelets: 96 10*3/uL — ABNORMAL LOW (ref 150–400)
RDW: 15.1 % (ref 11.5–15.5)
WBC: 11.9 10*3/uL — ABNORMAL HIGH (ref 4.0–10.5)

## 2011-02-01 LAB — DIFFERENTIAL
Basophils Absolute: 0.1 10*3/uL (ref 0.0–0.1)
Eosinophils Absolute: 0 10*3/uL (ref 0.0–0.7)
Eosinophils Relative: 0 % (ref 0–5)
Monocytes Absolute: 1.5 10*3/uL — ABNORMAL HIGH (ref 0.1–1.0)
Neutrophils Relative %: 77 % (ref 43–77)

## 2011-02-01 LAB — COMPREHENSIVE METABOLIC PANEL
BUN: 14 mg/dL (ref 6–23)
Calcium: 9 mg/dL (ref 8.4–10.5)
GFR calc Af Amer: 12 mL/min — ABNORMAL LOW (ref >60)
GFR calc non Af Amer: 10 mL/min — ABNORMAL LOW (ref >60)
Glucose, Bld: 127 mg/dL — ABNORMAL HIGH (ref 70–99)
Total Protein: 6.2 g/dL (ref 6.0–8.3)

## 2011-02-01 LAB — PROCALCITONIN: Procalcitonin: 4.17 ng/mL

## 2011-02-01 MED ORDER — XENON XE 133 GAS
10.0000 | GAS_FOR_INHALATION | Freq: Once | RESPIRATORY_TRACT | Status: AC | PRN
  Administered 2011-02-01: 10 via RESPIRATORY_TRACT

## 2011-02-01 MED ORDER — TECHNETIUM TO 99M ALBUMIN AGGREGATED
5.0000 | Freq: Once | INTRAVENOUS | Status: AC | PRN
  Administered 2011-02-01: 5 via INTRAVENOUS

## 2011-02-02 LAB — PROTEIN ELECTROPH W RFLX QUANT IMMUNOGLOBULINS
Alpha-1-Globulin: 10.9 % — ABNORMAL HIGH (ref 2.9–4.9)
Alpha-2-Globulin: 17 % — ABNORMAL HIGH (ref 7.1–11.8)
Gamma Globulin: 11.5 % (ref 11.1–18.8)
Total Protein ELP: 4.8 g/dL — ABNORMAL LOW (ref 6.0–8.3)

## 2011-02-02 LAB — RENAL FUNCTION PANEL
CO2: 25 mEq/L (ref 19–32)
Chloride: 94 mEq/L — ABNORMAL LOW (ref 96–112)
GFR calc Af Amer: 9 mL/min — ABNORMAL LOW (ref >60)
GFR calc non Af Amer: 7 mL/min — ABNORMAL LOW (ref >60)
Glucose, Bld: 85 mg/dL (ref 70–99)
Potassium: 3.6 mEq/L (ref 3.5–5.1)
Sodium: 128 mEq/L — ABNORMAL LOW (ref 135–145)

## 2011-02-02 LAB — UIFE/LIGHT CHAINS/TP QN, 24-HR UR
Albumin, U: DETECTED
Alpha 1, Urine: DETECTED — AB
Alpha 2, Urine: DETECTED — AB
Free Kappa Lt Chains,Ur: 109 mg/dL — ABNORMAL HIGH (ref 0.14–2.42)
Free Kappa/Lambda Ratio: 1.86 ratio — ABNORMAL LOW (ref 2.04–10.37)
Total Protein, Urine: 196.4 mg/dL

## 2011-02-02 LAB — IMMUNOFIXATION ADD-ON

## 2011-02-02 LAB — CBC
Hemoglobin: 7.3 g/dL — ABNORMAL LOW (ref 13.0–17.0)
Platelets: 156 10*3/uL (ref 150–400)
RBC: 2.19 MIL/uL — ABNORMAL LOW (ref 4.22–5.81)
WBC: 6.9 10*3/uL (ref 4.0–10.5)

## 2011-02-02 LAB — PREPARE RBC (CROSSMATCH)

## 2011-02-02 NOTE — Progress Notes (Signed)
George Benson, KATHAN NO.:  [id number]  MEDICAL RECORD NO.:  [id number]  LOCATION:  3738                         FACILITY:  [facility]  PHYSICIAN:  George Blower, MD       DATE OF BIRTH:  06-Mar-1956                                PROGRESS NOTE   PRIMARY CARE PHYSICIAN: Sharlot Gowda, MD  CONSULTATIONS: [facility] evaluated the patient during the course of hospital stay.  BRIEF ADMITTING HISTORY AND PHYSICAL: George Benson is a 55 year old gentleman with history of coronary artery disease status post drug-eluting stent placed in 2007 with history of GERD and dyslipidemia who presented on January 20, 2011 with nausea and vomiting.  RADIOLOGY/IMAGING STUDIES: 1. The patient had a complete abdominal ultrasound on January 20, 2011 which was unremarkable. 2. The patient had multiple chest x-rays during the course of hospital     stay.  The initial chest x-ray on January 21, 2011 showed     vascular congestion with bilateral central airspace opacification     compatible with pulmonary edema. 3. The patient had chest x-ray two-view most recently on February 01, 2011 which showed bilateral pleural effusions, right greater than     left with slightly decreased in size on the right from January 31, 2011.  Bilateral airspace disease, right greater than left with     improved aeration at the left lung base.  LABORATORY DATA: Most recent labs showed a CBC with a white count of 6.9, hemoglobin 7.3, hematocrit 22.2, and platelet count 156.  Electrolytes:  Sodium 128, potassium 3.6, chloride 96, CO2 25, BUN 22, and creatinine 7.98.  ASSESSMENT AND PLAN: 1. Acute renal failure with acute tubular necrosis.  The patient     initially had femoral hemodialysis catheter placed and had 3     dialysis sessions.  Etiology of the acute renal failure is unclear     at this time.  Femoral catheter was removed on January 29, 2011     to determine where the  renal function would improve with     conservative with conservative  management.  On Sept 22, 2012     the patient during the course of hospital stay became short of     breath.  As a result, tunneled hemodialysis catheter was placed     and had dialysis. 2. Shortness of breath, likely due to aspiration pneumonia and volume     overload from acute renal failure.  Improved with diuresis and     treatment of pneumonia. 3. Aspiration pneumonia.  The patient currently on levofloxacin, has     been on antibiotics since January 23, 2011.  Breathing has     improved.  We will discontinue antibiotics after today, February 02, 2011, to complete at least a 10-day course. 4. Syncope on January 31, 2011 during dialysis sessions.  Etiology     unclear.  The patient was placed on tele and his troponins trended     x2.  V/Q scan was low probability for pulmonary embolism.  Suspect     syncope with hypoxia is likely due to  intravascular volume     depletion during dialysis. 5. Anemia, likely due to alcohol use and anemia due to acute renal     failure.  We will transfuse the patient 2 units of PRBCs, the first     unit on February 02, 2011 and another unit on February 03, 2011     while the patient is having hemodialysis to tank the patient prior     to hemodialysis. 6. Thrombocytopenia, likely due to alcohol use. 7. Alcohol abuse.  The patient is on thiamine and folate, completed     CIWA protocol.  The patient is motivated to stop drinking alcohol.     Social worker is discussing outpatient resources for the patient     prior to discharge. 8. Prophylaxis, sequential compression devices. 9. Disposition.  Currently pending on patient tolerating dialysis and     also arranging for outpatient dialysis prior to discharge.     George Blower, MD     SR/MEDQ  D:  02/02/2011  T:  02/02/2011  Job:  [id number]  Electronically Signed by Wardell Heath Marigny Borre  on 02/02/2011 09:49:31 PM

## 2011-02-03 ENCOUNTER — Inpatient Hospital Stay (HOSPITAL_COMMUNITY): Payer: BC Managed Care – PPO

## 2011-02-03 LAB — BASIC METABOLIC PANEL
BUN: 27 mg/dL — ABNORMAL HIGH (ref 6–23)
Calcium: 8.7 mg/dL (ref 8.4–10.5)
GFR calc Af Amer: 7 mL/min — ABNORMAL LOW (ref >60)
GFR calc non Af Amer: 6 mL/min — ABNORMAL LOW (ref >60)
Glucose, Bld: 82 mg/dL (ref 70–99)
Potassium: 3.5 mEq/L (ref 3.5–5.1)
Sodium: 126 mEq/L — ABNORMAL LOW (ref 135–145)

## 2011-02-03 LAB — CBC
HCT: 23.1 % — ABNORMAL LOW (ref 39.0–52.0)
Hemoglobin: 7.7 g/dL — ABNORMAL LOW (ref 13.0–17.0)
MCH: 32.2 pg (ref 26.0–34.0)
MCHC: 33.3 g/dL (ref 30.0–36.0)
RDW: 17 % — ABNORMAL HIGH (ref 11.5–15.5)

## 2011-02-03 LAB — IGG, IGA, IGM
IgA: 404 mg/dL — ABNORMAL HIGH (ref 68–379)
IgM, Serum: 46 mg/dL — ABNORMAL LOW (ref 41–251)

## 2011-02-04 LAB — TYPE AND SCREEN
Antibody Screen: NEGATIVE
Unit division: 0

## 2011-02-04 LAB — RENAL FUNCTION PANEL
Calcium: 8.3 mg/dL — ABNORMAL LOW (ref 8.4–10.5)
Creatinine, Ser: 5.43 mg/dL — ABNORMAL HIGH (ref 0.50–1.35)
GFR calc Af Amer: 13 mL/min — ABNORMAL LOW (ref >60)
GFR calc non Af Amer: 11 mL/min — ABNORMAL LOW (ref >60)
Phosphorus: 4.4 mg/dL (ref 2.3–4.6)
Sodium: 133 mEq/L — ABNORMAL LOW (ref 135–145)

## 2011-02-04 LAB — CBC
MCH: 31.6 pg (ref 26.0–34.0)
MCHC: 33.9 g/dL (ref 30.0–36.0)
MCV: 93.1 fL (ref 78.0–100.0)
Platelets: 179 10*3/uL (ref 150–400)
RDW: 18.7 % — ABNORMAL HIGH (ref 11.5–15.5)

## 2011-02-05 LAB — RENAL FUNCTION PANEL
Albumin: 2 g/dL — ABNORMAL LOW (ref 3.5–5.2)
BUN: 14 mg/dL (ref 6–23)
Calcium: 9.2 mg/dL (ref 8.4–10.5)
Creatinine, Ser: 7.12 mg/dL — ABNORMAL HIGH (ref 0.50–1.35)
GFR calc non Af Amer: 8 mL/min — ABNORMAL LOW (ref >60)

## 2011-02-06 LAB — CBC
Hemoglobin: 10.4 g/dL — ABNORMAL LOW (ref 13.0–17.0)
MCH: 31.4 pg (ref 26.0–34.0)
MCHC: 33.3 g/dL (ref 30.0–36.0)
MCV: 94.3 fL (ref 78.0–100.0)
RBC: 3.31 MIL/uL — ABNORMAL LOW (ref 4.22–5.81)

## 2011-02-06 LAB — RENAL FUNCTION PANEL
CO2: 23 mEq/L (ref 19–32)
Calcium: 9.3 mg/dL (ref 8.4–10.5)
Creatinine, Ser: 8.78 mg/dL — ABNORMAL HIGH (ref 0.50–1.35)
GFR calc Af Amer: 8 mL/min — ABNORMAL LOW (ref >60)
Glucose, Bld: 98 mg/dL (ref 70–99)
Phosphorus: 6.3 mg/dL — ABNORMAL HIGH (ref 2.3–4.6)
Sodium: 128 mEq/L — ABNORMAL LOW (ref 135–145)

## 2011-02-07 LAB — RENAL FUNCTION PANEL
BUN: 23 mg/dL (ref 6–23)
CO2: 22 mEq/L (ref 19–32)
Calcium: 9.2 mg/dL (ref 8.4–10.5)
Chloride: 93 mEq/L — ABNORMAL LOW (ref 96–112)
Creatinine, Ser: 9.73 mg/dL — ABNORMAL HIGH (ref 0.50–1.35)
Glucose, Bld: 87 mg/dL (ref 70–99)

## 2011-02-08 LAB — PROTEIN / CREATININE RATIO, URINE
Creatinine, Urine: 42.27 mg/dL
Protein Creatinine Ratio: 0.78 — ABNORMAL HIGH (ref 0.00–0.15)
Total Protein, Urine: 33 mg/dL

## 2011-02-08 LAB — RENAL FUNCTION PANEL
Albumin: 2 g/dL — ABNORMAL LOW (ref 3.5–5.2)
BUN: 28 mg/dL — ABNORMAL HIGH (ref 6–23)
CO2: 22 mEq/L (ref 19–32)
Chloride: 93 mEq/L — ABNORMAL LOW (ref 96–112)
Creatinine, Ser: 10.92 mg/dL — ABNORMAL HIGH (ref 0.50–1.35)
Glucose, Bld: 81 mg/dL (ref 70–99)
Potassium: 3.7 mEq/L (ref 3.5–5.1)

## 2011-02-09 LAB — COMPREHENSIVE METABOLIC PANEL
AST: 13 U/L (ref 0–37)
Albumin: 2.1 g/dL — ABNORMAL LOW (ref 3.5–5.2)
Alkaline Phosphatase: 118 U/L — ABNORMAL HIGH (ref 39–117)
BUN: 30 mg/dL — ABNORMAL HIGH (ref 6–23)
Chloride: 95 mEq/L — ABNORMAL LOW (ref 96–112)
Potassium: 3.8 mEq/L (ref 3.5–5.1)
Sodium: 130 mEq/L — ABNORMAL LOW (ref 135–145)
Total Bilirubin: 0.4 mg/dL (ref 0.3–1.2)
Total Protein: 5.7 g/dL — ABNORMAL LOW (ref 6.0–8.3)

## 2011-02-09 LAB — PHOSPHORUS: Phosphorus: 8.4 mg/dL — ABNORMAL HIGH (ref 2.3–4.6)

## 2011-02-09 NOTE — H&P (Signed)
NAMEDINK, CREPS NO.:  [id number]  MEDICAL RECORD NO.:  [id number]  LOCATION:  1241                         FACILITY:  [facility]  PHYSICIAN:  Thomasenia Bottoms, MDDATE OF BIRTH:  11-[name]-1955  DATE OF ADMISSION:  01/19/2011 DATE OF DISCHARGE:                             HISTORY & PHYSICAL   PRIMARY CARE PHYSICIAN:  Sharlot Gowda, M.D.  CHIEF COMPLAINT:  Nausea and vomiting for 4 days.  HISTORY OF PRESENTING ILLNESS:  Most of the history is given by the patient's wife.  The patient apparently has been having trouble with his stomach.  He has been having trouble with nausea and vomiting and not eating much over the last 4 days.  The patient's wife says he has lost about 8 pounds during that time frame.  He denies vomiting any blood or coughing up any blood, no bright red blood per rectum or in his urine. The patient was told to come in by his primary care physician.  The patient was seen here and found to have an abnormal BUN and creatinine.  PAST MEDICAL HISTORY:  The patient's past medical history is significant for drug-eluting stents in 2007.  That is the patient's only medical problem per the family.  He is on medication for dyslipidemia and GERD as well.  MEDICATIONS:  On arrival per the medical record include: 1. Prilosec 20 mg p.o. daily. 2. AndroGel 50 mg/5 grams, apply daily. 3. Colesevelam 3.7 grams pack 1 daily. 4. Dexilant 60 mg p.o. daily. 5. Rosuvastatin 40 mg p.o. daily. 6. Metoprolol tartrate 25 mg 1 p.o. daily. 7. Losartan/hydrochlorothiazide 100/25 mg 1 p.o. daily. 8. Bupropion SR 150 2 tablets p.o. every evening at bedtime. 9. Aspirin 81 mg daily.  SOCIAL HISTORY:  The patient does smoke cigarettes.  He is a heavy drinker.  The patient tells me he had been drinking heavily for over a decade and that he had not been sober for over a month and all of that time.  No illicit drug use.  He is a car part salesman.  FAMILY HISTORY:  The  patient says he has several uncles, at least 3 who had cirrhosis because of alcohol abuse.  REVIEW OF SYSTEMS:  The patient is hallucinating and unreliable in the emergency department.  PHYSICAL EXAMINATION:  VITALS:  Initial blood pressure was 87/59, temperature 98.4, pulse 90, respiratory rate 15, O2 sats 98% on 2 liters nasal cannula. GENERAL:  The patient is quite tremulous, but in no acute distress. HEENT:  Normocephalic and atraumatic.  His pupils were equal and round. Sclerae  nonicteric.  Oral mucosa moist. NECK:  Supple.  No lymphadenopathy, no thyromegaly, no jugular venous distention. CARDIAC:  Regular rate and rhythm.  No murmurs, gallops, or rubs. LUNGS:  Clear to auscultation bilaterally.  No wheezes, rhonchi, or rales. ABDOMEN:  Soft, nontender.  During my examination, I did not appreciate hepatosplenomegaly.  No rebound or guarding. EXTREMITIES:  Reveal no evidence of clubbing, cyanosis, or pitting edema.  He does have palpable DP pulses bilaterally. SKIN:  Warm, dry, and intact.  No open lesions or rashes. MUSCULOSKELETAL:  Reveals no evidence of effusion of his joints. NEUROLOGIC:  He is awake and alert.  He answers questions, but he is frequently inappropriate.  He is not having hallucinations at the time of my evaluation, but just prior to my evaluation he was seeing things that were not there.  He has diffused tremor at rest, mostly noted at his hands.  He does follow commands and move each of his extremities. No clear evidence of focal motor asymmetry.  Sensory examination is deferred.  DATA:  His white count is 9.1, hemoglobin 13.7, hematocrit 39.7, platelet count is 93.  Sodium of 129, potassium 3.1, chloride is 91, bicarb 17, glucose 120, BUN 39, and creatinine 7.44.  Alcohol level is undetectable.  AST is 1586, ALT is 614, alk phos is 397, total bili is 0.8.  EKG:  The patient has sinus rhythm, rate is 87, early R-wave transition in the precordial leads,  but no ST-segment elevation or depression.  I did review his EKG.  ASSESSMENT AND PLAN: 1. Acute renal failure, creatinine of 7.  The patient has been making     urine over the last several days, the urine ultrasound is pending.     We will check a bladder scan and will put in a Foley as well, but I     suspect that at least part of the reason for his renal failure is     volume depletion.  He will be hydrated aggressively.  We will await     the results of the ultrasound and will monitor his urine output     carefully. 2. Mild hypotension.  I suspect this is a volume depletion.  Blood     pressure has started to improve with IV fluids.  He will be     monitored in the step-down unit and he will be hydrated     aggressively. 3. Acute alcohol withdrawal.  The patient is having active     hallucinations and is quite tremulous.  He will be put on a detox     protocol and be monitored carefully in the step-down unit. 4. Hyponatremia and hypokalemia, likely because of the nausea,     vomiting, and volume depletion.  He will be put on IV fluids and     will follow.  We will not aggressively replace his potassium until    his creatinine improves and we are sure about his urine output. 5. Thrombocytopenia.  We will await the results of the abdominal     ultrasound, but certainly this may be due to the alcohol abuse.  I     did speak at length to the patient and his wife about alcohol abuse     and the dangers of alcohol withdrawal.  The patient's wife had no     idea that the patient's drinking was so bad, she was quite shocked     and upset by this news.  The patient was not surprised and was     quite still during the conversation.  Unfortunately, he is not     ready to go into rehab or to quit drinking.  He says he will think     about it, but certainly addiction     consultation would be warranted for this gentleman once he     improves.  There is no indication for acute hemodialysis at  this     time.  The patient is not volume overload, his creatinine is not     elevated, and we will be monitoring his urine output carefully over  the next several hours.     Thomasenia Bottoms, MD     CVC/MEDQ  D:  01/20/2011  T:  01/20/2011  Job:  [id number]  cc:   Sharlot Gowda, M.D. Fax: [phone]  Electronically Signed by Buena Irish MD on 02/09/2011 06:07:36 PM

## 2011-02-10 LAB — RENAL FUNCTION PANEL
Albumin: 2.1 g/dL — ABNORMAL LOW (ref 3.5–5.2)
Chloride: 94 mEq/L — ABNORMAL LOW (ref 96–112)
GFR calc Af Amer: 5 mL/min — ABNORMAL LOW (ref >90)
Glucose, Bld: 85 mg/dL (ref 70–99)
Phosphorus: 8.9 mg/dL — ABNORMAL HIGH (ref 2.3–4.6)
Potassium: 3.5 mEq/L (ref 3.5–5.1)
Sodium: 129 mEq/L — ABNORMAL LOW (ref 135–145)

## 2011-02-10 LAB — ANA: Anti Nuclear Antibody(ANA): NEGATIVE

## 2011-02-11 LAB — CBC
MCH: 30.9 pg (ref 26.0–34.0)
MCHC: 33.1 g/dL (ref 30.0–36.0)
Platelets: 302 10*3/uL (ref 150–400)
RDW: 15.7 % — ABNORMAL HIGH (ref 11.5–15.5)

## 2011-02-11 LAB — RENAL FUNCTION PANEL
Albumin: 2 g/dL — ABNORMAL LOW (ref 3.5–5.2)
Calcium: 9.3 mg/dL (ref 8.4–10.5)
Chloride: 98 mEq/L (ref 96–112)
Creatinine, Ser: 12.36 mg/dL — ABNORMAL HIGH (ref 0.50–1.35)
GFR calc Af Amer: 5 mL/min — ABNORMAL LOW (ref >90)
GFR calc non Af Amer: 4 mL/min — ABNORMAL LOW (ref >90)
Sodium: 133 mEq/L — ABNORMAL LOW (ref 135–145)

## 2011-02-12 LAB — RENAL FUNCTION PANEL
Albumin: 2.1 g/dL — ABNORMAL LOW (ref 3.5–5.2)
Calcium: 8.9 mg/dL (ref 8.4–10.5)
GFR calc Af Amer: 9 mL/min — ABNORMAL LOW (ref >90)
GFR calc non Af Amer: 8 mL/min — ABNORMAL LOW (ref >90)
Phosphorus: 5.1 mg/dL — ABNORMAL HIGH (ref 2.3–4.6)
Sodium: 138 mEq/L (ref 135–145)

## 2011-02-13 LAB — RENAL FUNCTION PANEL
Albumin: 2.3 g/dL — ABNORMAL LOW (ref 3.5–5.2)
CO2: 23 mEq/L (ref 19–32)
Calcium: 9.6 mg/dL (ref 8.4–10.5)
Creatinine, Ser: 8.13 mg/dL — ABNORMAL HIGH (ref 0.50–1.35)
GFR calc non Af Amer: 7 mL/min — ABNORMAL LOW (ref >90)
Phosphorus: 5.6 mg/dL — ABNORMAL HIGH (ref 2.3–4.6)

## 2011-02-14 ENCOUNTER — Inpatient Hospital Stay (HOSPITAL_COMMUNITY): Payer: BC Managed Care – PPO

## 2011-02-14 LAB — PROTIME-INR: Prothrombin Time: 14.6 seconds (ref 11.6–15.2)

## 2011-02-14 LAB — RENAL FUNCTION PANEL
BUN: 19 mg/dL (ref 6–23)
Glucose, Bld: 89 mg/dL (ref 70–99)
Phosphorus: 5.7 mg/dL — ABNORMAL HIGH (ref 2.3–4.6)
Potassium: 4 mEq/L (ref 3.5–5.1)

## 2011-02-14 LAB — PLATELET FUNCTION ASSAY: Collagen / Epinephrine: 128 seconds (ref 0–184)

## 2011-02-14 LAB — CBC
Hemoglobin: 10 g/dL — ABNORMAL LOW (ref 13.0–17.0)
MCH: 30.9 pg (ref 26.0–34.0)
MCHC: 31.6 g/dL (ref 30.0–36.0)

## 2011-02-15 ENCOUNTER — Other Ambulatory Visit: Payer: Self-pay | Admitting: Interventional Radiology

## 2011-02-15 ENCOUNTER — Inpatient Hospital Stay (HOSPITAL_COMMUNITY): Payer: BC Managed Care – PPO

## 2011-02-15 DIAGNOSIS — N19 Unspecified kidney failure: Secondary | ICD-10-CM

## 2011-02-15 LAB — CBC
Hemoglobin: 9.3 g/dL — ABNORMAL LOW (ref 13.0–17.0)
MCH: 31.4 pg (ref 26.0–34.0)
RBC: 2.96 MIL/uL — ABNORMAL LOW (ref 4.22–5.81)

## 2011-02-15 LAB — RENAL FUNCTION PANEL
Albumin: 2.2 g/dL — ABNORMAL LOW (ref 3.5–5.2)
Calcium: 9 mg/dL (ref 8.4–10.5)
GFR calc Af Amer: 13 mL/min — ABNORMAL LOW (ref >90)
Glucose, Bld: 94 mg/dL (ref 70–99)
Phosphorus: 3 mg/dL (ref 2.3–4.6)
Potassium: 3.3 mEq/L — ABNORMAL LOW (ref 3.5–5.1)
Sodium: 139 mEq/L (ref 135–145)

## 2011-02-15 LAB — PTH, INTACT AND CALCIUM: PTH: 57 pg/mL (ref 14.0–72.0)

## 2011-02-15 NOTE — Progress Notes (Signed)
  George Benson, TAMBURRINO NO.:  [id number]  MEDICAL RECORD NO.:  [id number]  LOCATION:  3738                         FACILITY:  [facility]  PHYSICIAN:  Peggye Pitt, M.D. DATE OF BIRTH:  06/19/1955                                PROGRESS NOTE   PRIMARY CARE PHYSICIAN: Sharlot Gowda, M.D.  For details of hospital course, please refer to progress note by Dr. Betti Cruz on September 25.  Over the past 7 days, the patient has had no change.  His last hemodialysis session was on September 26.  We now have decided to hold off on dialyzing him at this time.  He continues to have good urine output of over 1000 mL a day; however, his creatinine continues to rise, today it is at 11.26.  He has not uremic, has no acute indication for hemodialysis, so plan as per [facility], at this point is to observe him.  DIAGNOSES: 1. Acute renal failure, presumed secondary to acute tubular necrosis. 2. Hyponatremia, improved with fluid restriction. 3. Aspiration pneumonia, concluded treatment. 4. Alcohol abuse. 5. Thrombocytopenia.     Peggye Pitt, M.D.     EH/MEDQ  D:  02/09/2011  T:  02/09/2011  Job:  [id number]  Electronically Signed by Peggye Pitt M.D. on 02/15/2011 07:51:17 AM

## 2011-02-16 LAB — RENAL FUNCTION PANEL
Albumin: 2.2 g/dL — ABNORMAL LOW (ref 3.5–5.2)
BUN: 12 mg/dL (ref 6–23)
Chloride: 102 mEq/L (ref 96–112)
Creatinine, Ser: 6.41 mg/dL — ABNORMAL HIGH (ref 0.50–1.35)
Glucose, Bld: 84 mg/dL (ref 70–99)
Phosphorus: 4.8 mg/dL — ABNORMAL HIGH (ref 2.3–4.6)
Potassium: 3.1 mEq/L — ABNORMAL LOW (ref 3.5–5.1)

## 2011-02-16 LAB — CBC
HCT: 25.1 % — ABNORMAL LOW (ref 39.0–52.0)
HCT: 26.1 % — ABNORMAL LOW (ref 39.0–52.0)
Hemoglobin: 8.4 g/dL — ABNORMAL LOW (ref 13.0–17.0)
MCH: 31.5 pg (ref 26.0–34.0)
MCH: 31.5 pg (ref 26.0–34.0)
MCHC: 32.2 g/dL (ref 30.0–36.0)
MCV: 97.7 fL (ref 78.0–100.0)
RBC: 2.57 MIL/uL — ABNORMAL LOW (ref 4.22–5.81)
RDW: 16.5 % — ABNORMAL HIGH (ref 11.5–15.5)
WBC: 5.7 10*3/uL (ref 4.0–10.5)

## 2011-02-17 LAB — RENAL FUNCTION PANEL
Albumin: 2.3 g/dL — ABNORMAL LOW (ref 3.5–5.2)
CO2: 23 mEq/L (ref 19–32)
Calcium: 9.6 mg/dL (ref 8.4–10.5)
Creatinine, Ser: 7.04 mg/dL — ABNORMAL HIGH (ref 0.50–1.35)
GFR calc non Af Amer: 8 mL/min — ABNORMAL LOW (ref >90)
Phosphorus: 4.4 mg/dL (ref 2.3–4.6)
Sodium: 139 mEq/L (ref 135–145)

## 2011-02-17 LAB — CBC
MCH: 31.2 pg (ref 26.0–34.0)
MCHC: 31.8 g/dL (ref 30.0–36.0)
MCV: 98 fL (ref 78.0–100.0)
Platelets: 189 10*3/uL (ref 150–400)
RDW: 16.6 % — ABNORMAL HIGH (ref 11.5–15.5)

## 2011-02-18 DIAGNOSIS — N19 Unspecified kidney failure: Secondary | ICD-10-CM

## 2011-02-18 LAB — RENAL FUNCTION PANEL
BUN: 18 mg/dL (ref 6–23)
CO2: 22 mEq/L (ref 19–32)
Calcium: 9.6 mg/dL (ref 8.4–10.5)
GFR calc Af Amer: 8 mL/min — ABNORMAL LOW (ref >90)
Glucose, Bld: 88 mg/dL (ref 70–99)
Sodium: 141 mEq/L (ref 135–145)

## 2011-03-01 NOTE — Consult Note (Signed)
NAMEGILMAR, BUA NO.:  [id number]  MEDICAL RECORD NO.:  [id number]  LOCATION:  3738                         FACILITY:  [facility]  PHYSICIAN:  Quita Skye. Hart Rochester, M.D.  DATE OF BIRTH:  1955-10-08  DATE OF CONSULTATION: DATE OF DISCHARGE:                                CONSULTATION   REASON FOR CONSULTATION:  Permanent hemodialysis access.  HISTORY OF PRESENT ILLNESS:  The patient is a 55 year old male who was admitted on January 19, 2011, for acute kidney injury.  The patient had complained on presentation of several days of nausea and vomiting with dizziness.  On evaluation in the emergency room, he was noted to be hypotensive with an elevated serum creatinine of 7.44.  The patient was admitted for treatment of medical issues and the renal service was consulted.  Since that time the patient's creatinine has continued to rise and he was started on hemodialysis via a right-sided Diatek catheter.  The patient is now in need of permanent hemodialysis access. The patient is left handed.  He denies any previous surgeries in his bilateral upper extremities.  PMH is significant for alcoholism, HTN, and CAD.  Currently, the patient denies signs and symptoms of TIA, CVA, chest pain, shortness of breath, nausea, vomiting, diarrhea, constipation and claudication.  PAST MEDICAL HISTORY: 2. Acute renal insufficiency, currently on hemodialysis. 3. Dyslipidemia. 4. Alcoholism. 5. Hypertension. 6. GERD. 7. Coronary artery disease status post percutaneous intervention with     stenting in 2007.  ALLERGIES:  No known drug allergies.  SOCIAL HISTORY:  The patient is married.  He admits to excessive alcohol intake and he does not smoke cigarettes.  FAMILY HISTORY: 1. Alcoholism. 2. Psoriasis.  CURRENT MEDICATIONS:  As an inpatient: 1. Nephro-Vite 1 tab p.o. at bedtime. 2. Pepcid 20 mg p.o. daily. 3. Fosrenol 1000 mg p.o. t.i.d. 4. Aranesp 200 mcg Thursday with  HD. 5. Lopressor 50 mg p.o. b.i.d. 6. Potassium chloride 40 mEq p.o. x1. 7. Norvasc 5 mg daily. 8. Resource Breeze nutritional supplement p.o. b.i.d. 9. Oxycodone 5 mg q.4 p.r.n.  REVIEW OF SYSTEMS:  Complete review of systems is negative.  Please see HPI for pertinent positives and negatives.  PHYSICAL EXAMINATION:  VITAL SIGNS:  Blood pressure 149/89, heart rate 84, O2 sat 99% on room air, and temp 98.1. GENERAL:  This is a male who is in no acute distress, and resting comfortably on the bed.  He appears of stated age.  He is thin. HEENT: Normocephalic, atraumatic.  PERRLA.  EOMI.  Speech is clear. CARDIAC:  Regular rate and rhythm. LUNGS:  Clear to auscultation. ABDOMEN:  Soft with active bowel sounds. MUSCULOSKELETAL:  Motor and sensation is intact in all 4 extremities. There are 2+ radial pulses present bilaterally.  The bilateral lower extremities are warm.  There is some bruising noted on the bilateral upper extremities.  The left arm has multiple sites where IVs have been placed and blood draws have been performed.  There are currently no IVs in the left arm.  There is an IV at the right wrist. NEURO:  Nonfocal. SKIN:  No evidence of rash or skin breakdown noted.  LABS:  CBC on February 17, 2011,  white count 6.1, hemoglobin 7.7, hematocrit 24.2, platelets 189.  BMP on February 17, 2011, sodium 139, potassium 3.5 BUN 16, creatinine 7.04, GFR 8.  Vein mapping was ordered, however, this is incomplete as only the basilic system of the left upper extremity was evaluated.  This does reveal a basilic vein that ranges from 1.9 mm at the wrist to 4 mm just below the antecubital space to 4 mm at the proximal brachium.  Depth is not documented.  ASSESSMENT: 1. Acute renal insufficiency, on hemodialysis via Diatek with need for     permanent access. 2. Coronary artery disease, alcoholism, hypertension.  PLANS: 1. The patient is under the impression that he will no longer  require     permanent dialysis in the future and he did not wish to have a     procedure or permanent access placed unless he will require long-     term HD.  I have spoken with Dr. Luan Pulling, primary physician who is     following the patient who has stated that he will in fact need long-     term hemodialysis access.  He is going to see the patient and     make sure that this is clear.  We will need to repeat vein mapping     for appropriate documentation of both the cephalic and basilic systems     in the bilateral upper extremities.  The patient is right handed,     therefore, we would like to place access in the left arm if     possible.  Once vein mapping is complete, we will review this     information and make a decision regarding appropriate access     placement.  1. The patient's remaining medical issues will continue to be     monitored and maintained by the primary care at [facility].     Pecola Leisure, PA   ______________________________ Quita Skye Hart Rochester, M.D.    AY/MEDQ  D:  02/17/2011  T:  02/18/2011  Job:  [id number]  Electronically Signed by Pecola Leisure PA on 02/26/2011 09:56:08 AM Electronically Signed by Josephina Gip M.D. on 03/01/2011 10:08:22 AM

## 2011-03-02 ENCOUNTER — Encounter: Payer: Self-pay | Admitting: Family Medicine

## 2011-03-02 ENCOUNTER — Inpatient Hospital Stay: Payer: BC Managed Care – PPO | Admitting: Family Medicine

## 2011-03-02 ENCOUNTER — Ambulatory Visit (INDEPENDENT_AMBULATORY_CARE_PROVIDER_SITE_OTHER): Payer: BC Managed Care – PPO | Admitting: Family Medicine

## 2011-03-02 VITALS — BP 120/78 | HR 74 | Wt 139.0 lb

## 2011-03-02 DIAGNOSIS — N179 Acute kidney failure, unspecified: Secondary | ICD-10-CM

## 2011-03-02 NOTE — Progress Notes (Signed)
  Subjective:    Patient ID: George Benson, male    DOB: [birth year], 55 y.o.   MRN: [id number]  HPI He is here for followup visit. His last visit here was September 11 when he was found to be in acute renal failure. He was also having alcohol withdrawal with hallucinations. He was admitted and then transferred to the renal unit and has been on dialysis every other day since then. An attempt was made to review his hospital record however I could not access it. Presently he is being followed by nephrology. The last blood work on October 11 did show a creatinine of approximately 7. He is interested in returning to work. He does verbalize that he understands the amount of damage alcohol has done and states that he will abstain from it.   Review of Systems     Objective:   Physical Exam alert and in no distress. Tympanic membranes and canals are normal. Throat is clear. Tonsils are normal. Neck is supple without adenopathy or thyromegaly. Cardiac exam shows a regular sinus rhythm without murmurs or gallops. Lungs are clear to auscultation.        Assessment & Plan:   1. Acute renal failure    he will continue in dialysis. I informed him that I would be happy to help him with any nonrenal issues. I will also help him if he has questions concerning his care in that regard. Discussed the need for him to remain alcohol free. Suggested AA however he is not interested. Followup here as needed

## 2011-03-18 ENCOUNTER — Ambulatory Visit: Payer: BC Managed Care – PPO | Admitting: Family Medicine

## 2011-03-27 ENCOUNTER — Other Ambulatory Visit: Payer: Self-pay | Admitting: Cardiovascular Disease

## 2011-03-29 ENCOUNTER — Other Ambulatory Visit: Payer: Self-pay

## 2011-03-29 MED ORDER — METOPROLOL TARTRATE 25 MG PO TABS: 50.0000 mg | ORAL_TABLET | Freq: Two times a day (BID) | ORAL | Status: DC

## 2011-04-05 ENCOUNTER — Encounter: Payer: Self-pay | Admitting: Family Medicine

## 2011-04-05 ENCOUNTER — Ambulatory Visit (INDEPENDENT_AMBULATORY_CARE_PROVIDER_SITE_OTHER): Payer: BC Managed Care – PPO | Admitting: Family Medicine

## 2011-04-05 VITALS — BP 130/90 | HR 62 | Wt 146.0 lb

## 2011-04-05 DIAGNOSIS — G479 Sleep disorder, unspecified: Secondary | ICD-10-CM

## 2011-04-05 DIAGNOSIS — R443 Hallucinations, unspecified: Secondary | ICD-10-CM

## 2011-04-05 DIAGNOSIS — R442 Other hallucinations: Secondary | ICD-10-CM

## 2011-04-05 DIAGNOSIS — N179 Acute kidney failure, unspecified: Secondary | ICD-10-CM

## 2011-04-05 NOTE — Patient Instructions (Signed)
No fluids after dinner. Indeed your bladder before you go to bed. Call me in 2 weeks.

## 2011-04-05 NOTE — Progress Notes (Signed)
  Subjective:    Patient ID: George Benson, male    DOB: 03-Apr-1956, 55 y.o.   MRN: [id number]  HPI He is here for consult concerning difficulty with sleep. He states the right before he falls asleep he feels as if someone is touching his feet and progressing up his legs. When he falls sleep he has no difficulty staying asleep except waking up every hour or so to urinate. He has been drinking lots of fluids to help with his kidney function. Most recently his creatinine was 2.1. He has been getting hemodialysis but apparently may soon be able to stop do to increased kidney function.   Review of Systems     Objective:   Physical Exam Alert and in no distress. Oriented x3       Assessment & Plan:   1. Sleep disturbance   2. Hypnagogic hallucinations    case was discussed with Dr. Nolen Mu who help to make this diagnosis. He will cut back on his fluids to decrease urinating at night. He will let me know in one or 2 weeks how he is doing. We may possibly need to use a mild sleep meds for a week or so to get him back on a regular schedule.

## 2011-04-06 ENCOUNTER — Other Ambulatory Visit (HOSPITAL_COMMUNITY): Payer: Self-pay | Admitting: Nephrology

## 2011-04-06 DIAGNOSIS — N186 End stage renal disease: Secondary | ICD-10-CM

## 2011-04-08 ENCOUNTER — Ambulatory Visit (HOSPITAL_COMMUNITY)
Admission: RE | Admit: 2011-04-08 | Discharge: 2011-04-08 | Disposition: A | Discharge status: Home / Self Care | Payer: BC Managed Care – PPO | Source: Ambulatory Visit | Attending: Nephrology | Admitting: Nephrology

## 2011-04-08 DIAGNOSIS — Z4901 Encounter for fitting and adjustment of extracorporeal dialysis catheter: Secondary | ICD-10-CM | POA: Insufficient documentation

## 2011-04-08 DIAGNOSIS — N186 End stage renal disease: Secondary | ICD-10-CM

## 2011-04-08 MED ORDER — CHLORHEXIDINE GLUCONATE 4 % EX LIQD
CUTANEOUS | Status: AC
  Filled 2011-04-08: qty 30

## 2011-04-29 ENCOUNTER — Other Ambulatory Visit: Payer: Self-pay | Admitting: Family Medicine

## 2011-04-29 NOTE — Telephone Encounter (Signed)
Is this ok?

## 2011-06-28 ENCOUNTER — Other Ambulatory Visit: Payer: Self-pay | Admitting: Cardiovascular Disease

## 2011-06-30 ENCOUNTER — Telehealth: Payer: Self-pay | Admitting: Family Medicine

## 2011-06-30 MED ORDER — METOPROLOL TARTRATE 25 MG PO TABS: ORAL_TABLET | ORAL | Status: AC

## 2011-06-30 NOTE — Telephone Encounter (Signed)
Check on the dosing of that with him. My notes say he is on 50 mg twice a day. Call in the appropriate dosing regimen

## 2011-06-30 NOTE — Telephone Encounter (Signed)
Ordered med.

## 2011-09-08 ENCOUNTER — Encounter: Payer: Self-pay | Admitting: Nephrology

## 2012-01-15 ENCOUNTER — Encounter (HOSPITAL_COMMUNITY): Payer: Self-pay

## 2012-01-15 ENCOUNTER — Emergency Department (HOSPITAL_COMMUNITY)
Admission: EM | Admit: 2012-01-15 | Discharge: 2012-01-15 | Disposition: A | Discharge status: Home / Self Care | Payer: Self-pay | Attending: Emergency Medicine | Admitting: Emergency Medicine

## 2012-01-15 DIAGNOSIS — F10229 Alcohol dependence with intoxication, unspecified: Secondary | ICD-10-CM | POA: Insufficient documentation

## 2012-01-15 DIAGNOSIS — W19XXXA Unspecified fall, initial encounter: Secondary | ICD-10-CM | POA: Insufficient documentation

## 2012-01-15 DIAGNOSIS — Z8249 Family history of ischemic heart disease and other diseases of the circulatory system: Secondary | ICD-10-CM | POA: Insufficient documentation

## 2012-01-15 DIAGNOSIS — I252 Old myocardial infarction: Secondary | ICD-10-CM | POA: Insufficient documentation

## 2012-01-15 DIAGNOSIS — Z7982 Long term (current) use of aspirin: Secondary | ICD-10-CM | POA: Insufficient documentation

## 2012-01-15 DIAGNOSIS — F10929 Alcohol use, unspecified with intoxication, unspecified: Secondary | ICD-10-CM

## 2012-01-15 DIAGNOSIS — T148XXA Other injury of unspecified body region, initial encounter: Secondary | ICD-10-CM | POA: Insufficient documentation

## 2012-01-15 DIAGNOSIS — F17200 Nicotine dependence, unspecified, uncomplicated: Secondary | ICD-10-CM | POA: Insufficient documentation

## 2012-01-15 DIAGNOSIS — I10 Essential (primary) hypertension: Secondary | ICD-10-CM | POA: Insufficient documentation

## 2012-01-15 HISTORY — DX: Unspecified kidney failure: N19

## 2012-01-15 NOTE — ED Notes (Signed)
Pt ambulated without difficulty,  Drank cup of ginger ale,  Wound care given,  Area cleaned with sterile salne,  Peroxide then dried and steri strips with gauze dressing placed

## 2012-01-15 NOTE — ED Notes (Signed)
ETOH on board,  Pt admits to falling against latice and cutting his left forearm open.  Pt is in NAD, warm blankets given for comfort

## 2012-01-15 NOTE — ED Notes (Signed)
Pt was hanging out with friends in a bar; consumed 3 beers. Pt admits to smoking a "joint"; Pt states that he fell down and scrapped his right forearm. Pt actively vomited during triage. Pt c/o not feeling well; n/v.

## 2012-01-15 NOTE — ED Provider Notes (Signed)
History     CSN: [id number]  Arrival date & time 01/15/12  0226   First MD Initiated Contact with Patient 01/15/12 [phone]      Chief Complaint  Patient presents with  . Alcohol Intoxication    (Consider location/radiation/quality/duration/timing/severity/associated sxs/prior treatment) HPI HX per PT, at a bar tonight, drinking alcohol, fell and cut his arm, bystanders called 911. At time of my evaluation state she is feeling better. Recalls catching his arm on lattice and sustained skin tears, bleeding controlled PTA. No head or neck injury. Not taking any medications, states lost his job and ran out of money so he doesn't take anything now. Not homeless.  Past Medical History  Diagnosis Date  . Hypertension   . Myocardial infarction   . Hyperlipidemia   . Kidney failure     History reviewed. No pertinent past surgical history.  Family History  Problem Relation Age of Onset  . Heart disease Father     Pacemaker    History  Substance Use Topics  . Smoking status: Current Everyday Smoker -- 0.5 packs/day for 30 years    Types: Cigarettes  . Smokeless tobacco: Never Used  . Alcohol Use: 4.2 oz/week    7 Cans of beer per week      Review of Systems  Constitutional: Negative for fever and chills.  HENT: Negative for neck pain and neck stiffness.   Eyes: Negative for pain.  Respiratory: Negative for shortness of breath.   Cardiovascular: Negative for chest pain.  Gastrointestinal: Negative for abdominal pain.  Genitourinary: Negative for dysuria.  Musculoskeletal: Negative for back pain.  Skin: Positive for wound. Negative for rash.  Neurological: Negative for seizures, syncope and headaches.  All other systems reviewed and are negative.    Allergies  Review of patient's allergies indicates no known allergies.  Home Medications   Current Outpatient Rx  Name Route Sig Dispense Refill  . AMLODIPINE BESYLATE 5 MG PO TABS Oral Take 5 mg by mouth daily.      .  ASPIRIN 81 MG PO TABS Oral Take 81 mg by mouth daily.      Marland Kitchen NEPHRO-VITE 0.8 MG PO TABS Oral Take by mouth 1 day or 1 dose.      . BUPROPION HCL ER (SR) 150 MG PO TB12 Oral Take 150 mg by mouth 2 (two) times daily.      . BUPROPION HCL ER (SR) 150 MG PO TB12  TAKE 1 TABLET BY MOUTH TWICE A DAY 60 tablet 2  . LANTHANUM CARBONATE 1000 MG PO CHEW Oral Chew 1,000 mg by mouth 3 (three) times daily with meals.      Marland Kitchen LOSARTAN POTASSIUM-HCTZ 100-25 MG PO TABS Oral Take 1 tablet by mouth daily. 30 tablet 11  . METOPROLOL TARTRATE 25 MG PO TABS  Take 2 (25 mg) am and 2(25 mg) pm 120 tablet 1    Patient needs to make appointment  . OMEPRAZOLE 20 MG PO CPDR Oral Take 20 mg by mouth daily.      Marland Kitchen ROSUVASTATIN CALCIUM 40 MG PO TABS Oral Take 1 tablet (40 mg total) by mouth daily. 30 tablet 11  . TESTOSTERONE 50 MG/5GM TD GEL Transdermal Place 5 g onto the skin daily. 1 Tube 5    BP 117/82  Pulse 85  Resp 16  SpO2 98%  Physical Exam  Constitutional: He is oriented to person, place, and time. He appears well-developed and well-nourished.  HENT:  Head: Normocephalic and atraumatic.  Eyes:  Conjunctivae and EOM are normal. Pupils are equal, round, and reactive to light.  Neck: Trachea normal. Neck supple. No thyromegaly present.  Cardiovascular: Normal rate, regular rhythm, S1 normal, S2 normal and normal pulses.     No systolic murmur is present   No diastolic murmur is present  Pulses:      Radial pulses are 2+ on the right side, and 2+ on the left side.  Pulmonary/Chest: Effort normal and breath sounds normal. He has no wheezes. He has no rhonchi. He has no rales. He exhibits no tenderness.  Abdominal: Soft. Normal appearance and bowel sounds are normal. There is no tenderness. There is no CVA tenderness and negative Murphy's sign.  Musculoskeletal:       LUE: multiple skin tears to FA. Distal N/V intact, no active bleeding  Neurological: He is alert and oriented to person, place, and time. He has  normal strength. No cranial nerve deficit or sensory deficit. GCS eye subscore is 4. GCS verbal subscore is 5. GCS motor subscore is 6.  Skin: Skin is warm and dry. No rash noted. He is not diaphoretic.  Psychiatric: His speech is normal.       Cooperative and appropriate    ED Course  Procedures (including critical care time)  PO water.   Ambulates NAD, no evidence of head injury  Wound care, states tetanus is UTD in the last 5 years.   etoh precautions and wound infx precautions verbalized as understood. I encouraged PT to see his physician and seek alternative means to pay for his medications - he states understanding this.  MDM   VS and nursing notes reviewed, steri strips to wounds, serial evaluations, sobering and stable for d/c home.         Sunnie Nielsen, MD 01/15/12 [phone]

## 2012-01-15 NOTE — ED Notes (Signed)
Per EMS: Pt fell into table and injury to left arm; left forearm wrapped in bandage; Calm and cooperative; VS within normal limits

## 2012-06-16 ENCOUNTER — Telehealth: Payer: Self-pay | Admitting: Internal Medicine

## 2012-06-16 NOTE — Telephone Encounter (Signed)
Faxed over medical records to disability determination services  

## 2012-07-27 DIAGNOSIS — Z0289 Encounter for other administrative examinations: Secondary | ICD-10-CM

## 2013-02-12 IMAGING — CR DG CHEST 1V PORT
1 series · 1 of 1 positions shown · non-contrast
Comparison: chest radiograph 01/21/2011

CLINICAL DATA: Short of breath

PORTABLE CHEST - 1 VIEW

[AP]
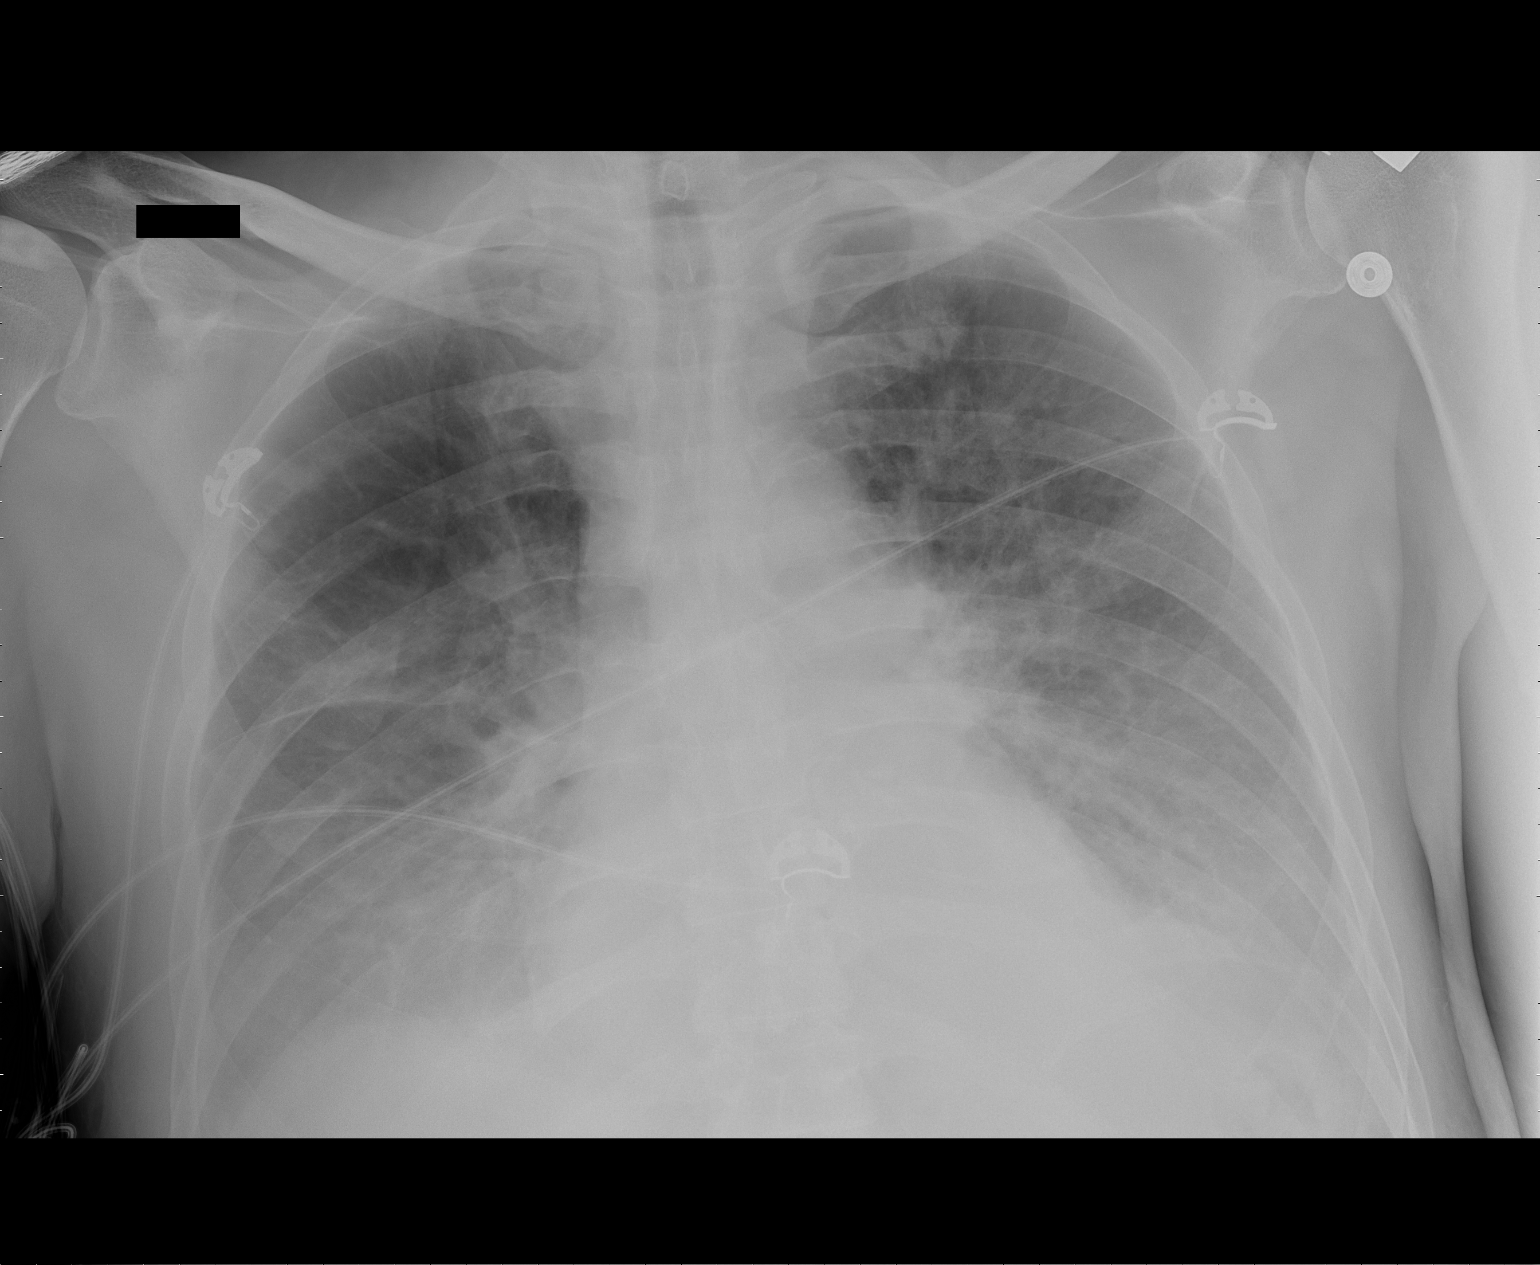

[1 of 1 positions shown; findings below may reference images not displayed]

FINDINGS: Normal mediastinum and cardiac silhouette.  There is
perihilar air space disease unchanged from prior.  Bilateral
pleural effusions appears slightly increased.  No pneumothorax.
IMPRESSION: 1.  Perihilar air space disease suggests pulmonary edema.
2.  Increase in pleural effusions.

## 2013-02-13 IMAGING — CR DG CHEST 1V PORT
1 series · 1 of 1 positions shown · non-contrast
Comparison: Chest radiograph performed 01/22/2011

CLINICAL DATA: Shortness of breath.

PORTABLE CHEST - 1 VIEW

[AP]
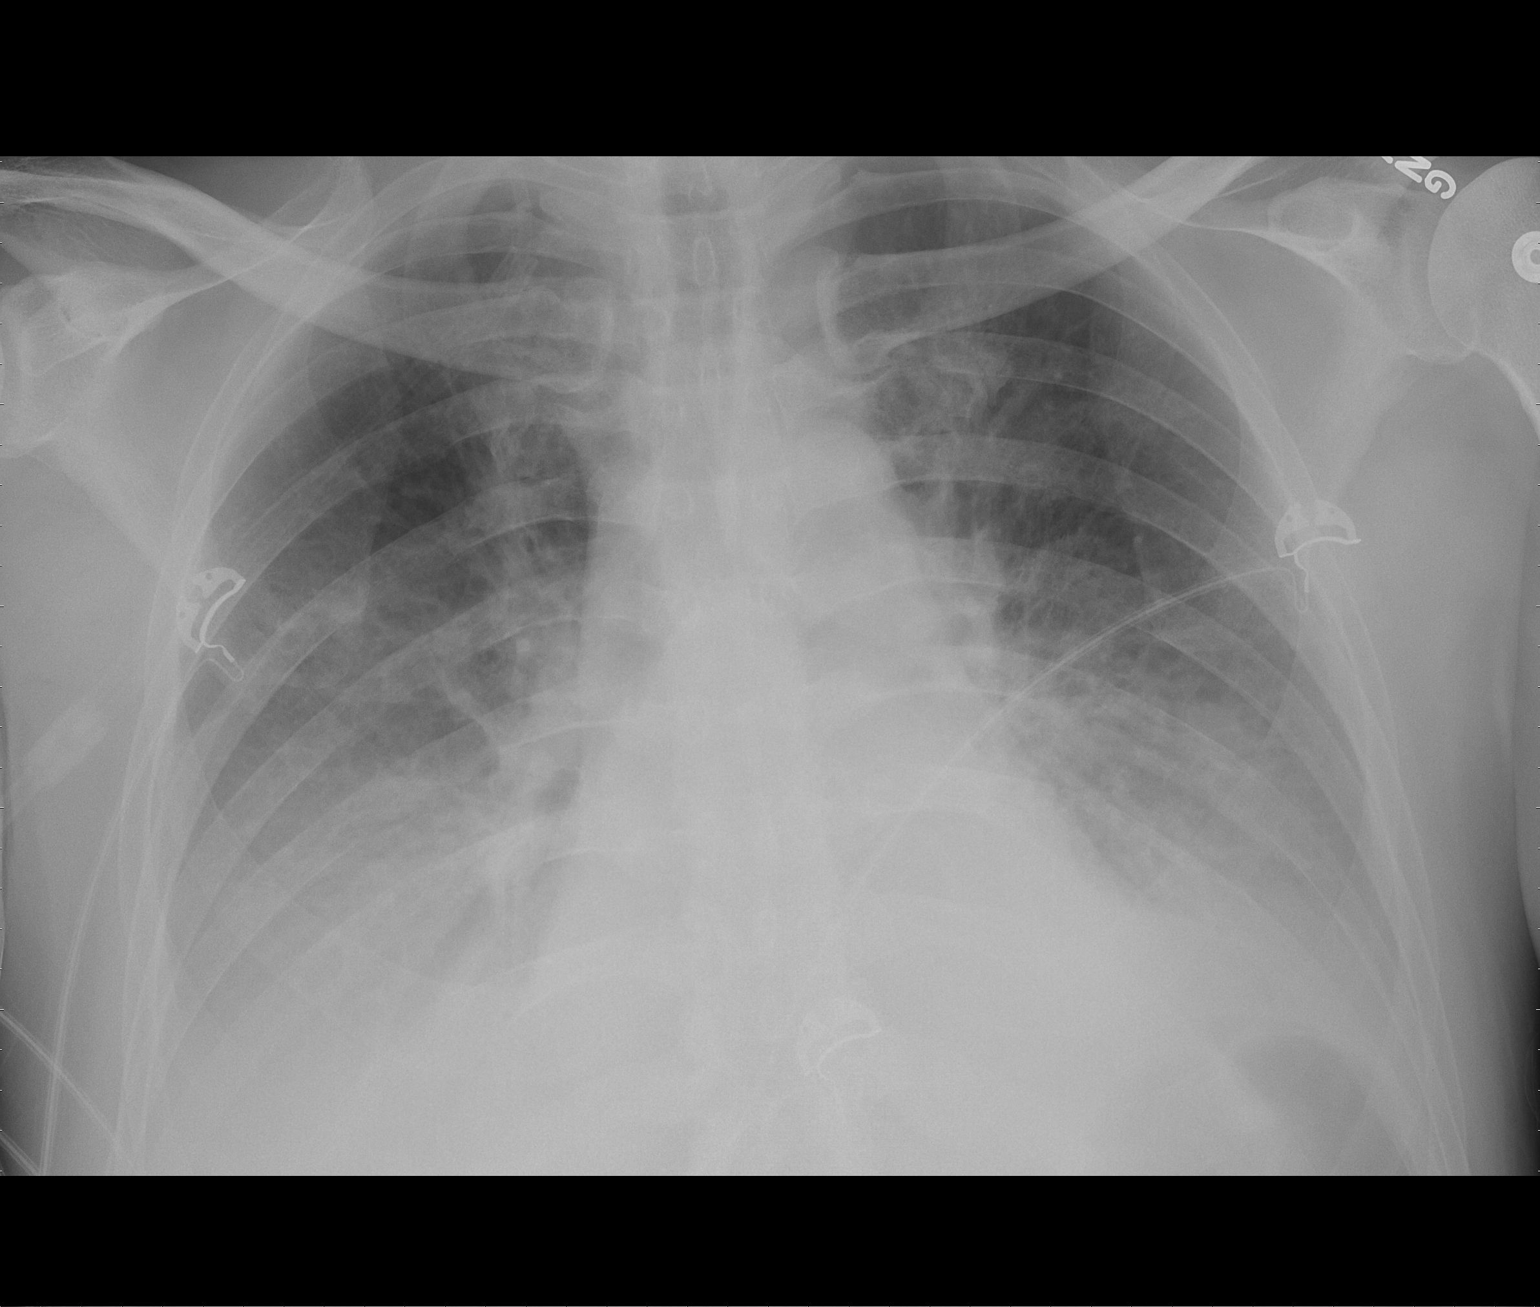

[1 of 1 positions shown; findings below may reference images not displayed]

FINDINGS: There is persistent bilateral central and bibasilar
airspace opacification, compatible with mildly worsening pulmonary
edema.  Small bilateral pleural effusions are again seen, perhaps
slightly increased from the prior study.  No pneumothorax
identified.  Underlying vascular congestion is noted.

The cardiomediastinal silhouette remains mildly enlarged.  No acute
osseous abnormalities are seen.
IMPRESSION: Slightly worsened bilateral pleural effusions, with likely mildly
worsened pulmonary edema; underlying vascular congestion and
cardiomegaly again noted.

## 2013-02-22 IMAGING — NM NM PULMONARY VENT & PERF
2 series · 12 of 12 positions shown · non-contrast
Comparison: Chest x-ray 02/01/2011

CLINICAL DATA: Difficulty breathing.

NUCLEAR MEDICINE VENTILATION - PERFUSION LUNG SCAN
TECHNIQUE: Wash-in, equilibrium, and wash-out phase ventilation
images were obtained using He-NDD gas.  Perfusion images were
obtained in multiple projections after intravenous injection of Tc-
99m MAA.
Radiopharmaceuticals:  10 mCi He-NDD gas and 5 mCi Lc-LLm MAA.

[vq scan · 2.52mm/px · 6 of 18 frames shown (1 of 2)]
[frame 2/18  full-range]
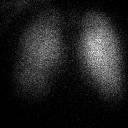
[frame 5/18  full-range]
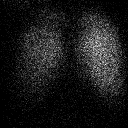
[frame 8/18  full-range]
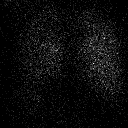
[frame 11/18]
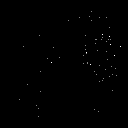
[frame 14/18]
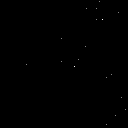
[frame 17/18]
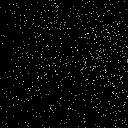

[vq scan · 2.52mm/px · 6 of 18 frames shown (2 of 2)]
[frame 2/18  full-range]
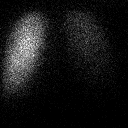
[frame 5/18  full-range]
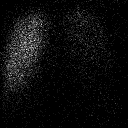
[frame 8/18]
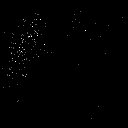
[frame 11/18]
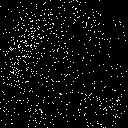
[frame 14/18]
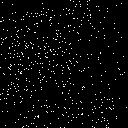
[frame 17/18]
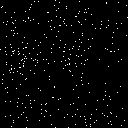

[12 of 12 positions shown; findings below may reference images not displayed]

FINDINGS: Ventilation portion of the study is unremarkable.  No
segmental or subsegmental perfusion defects or VQ mismatches.
Minimal patchy nonsegmental perfusion defects.
IMPRESSION: Very low probability study for pulmonary embolus.

## 2013-02-22 IMAGING — CR DG CHEST 2V
2 series · 2 of 2 positions shown · non-contrast
Comparison: 01/31/2011.

CLINICAL DATA: Cough, effusion, CHF, pneumonia.

CHEST - 2 VIEW

[w chest pa]
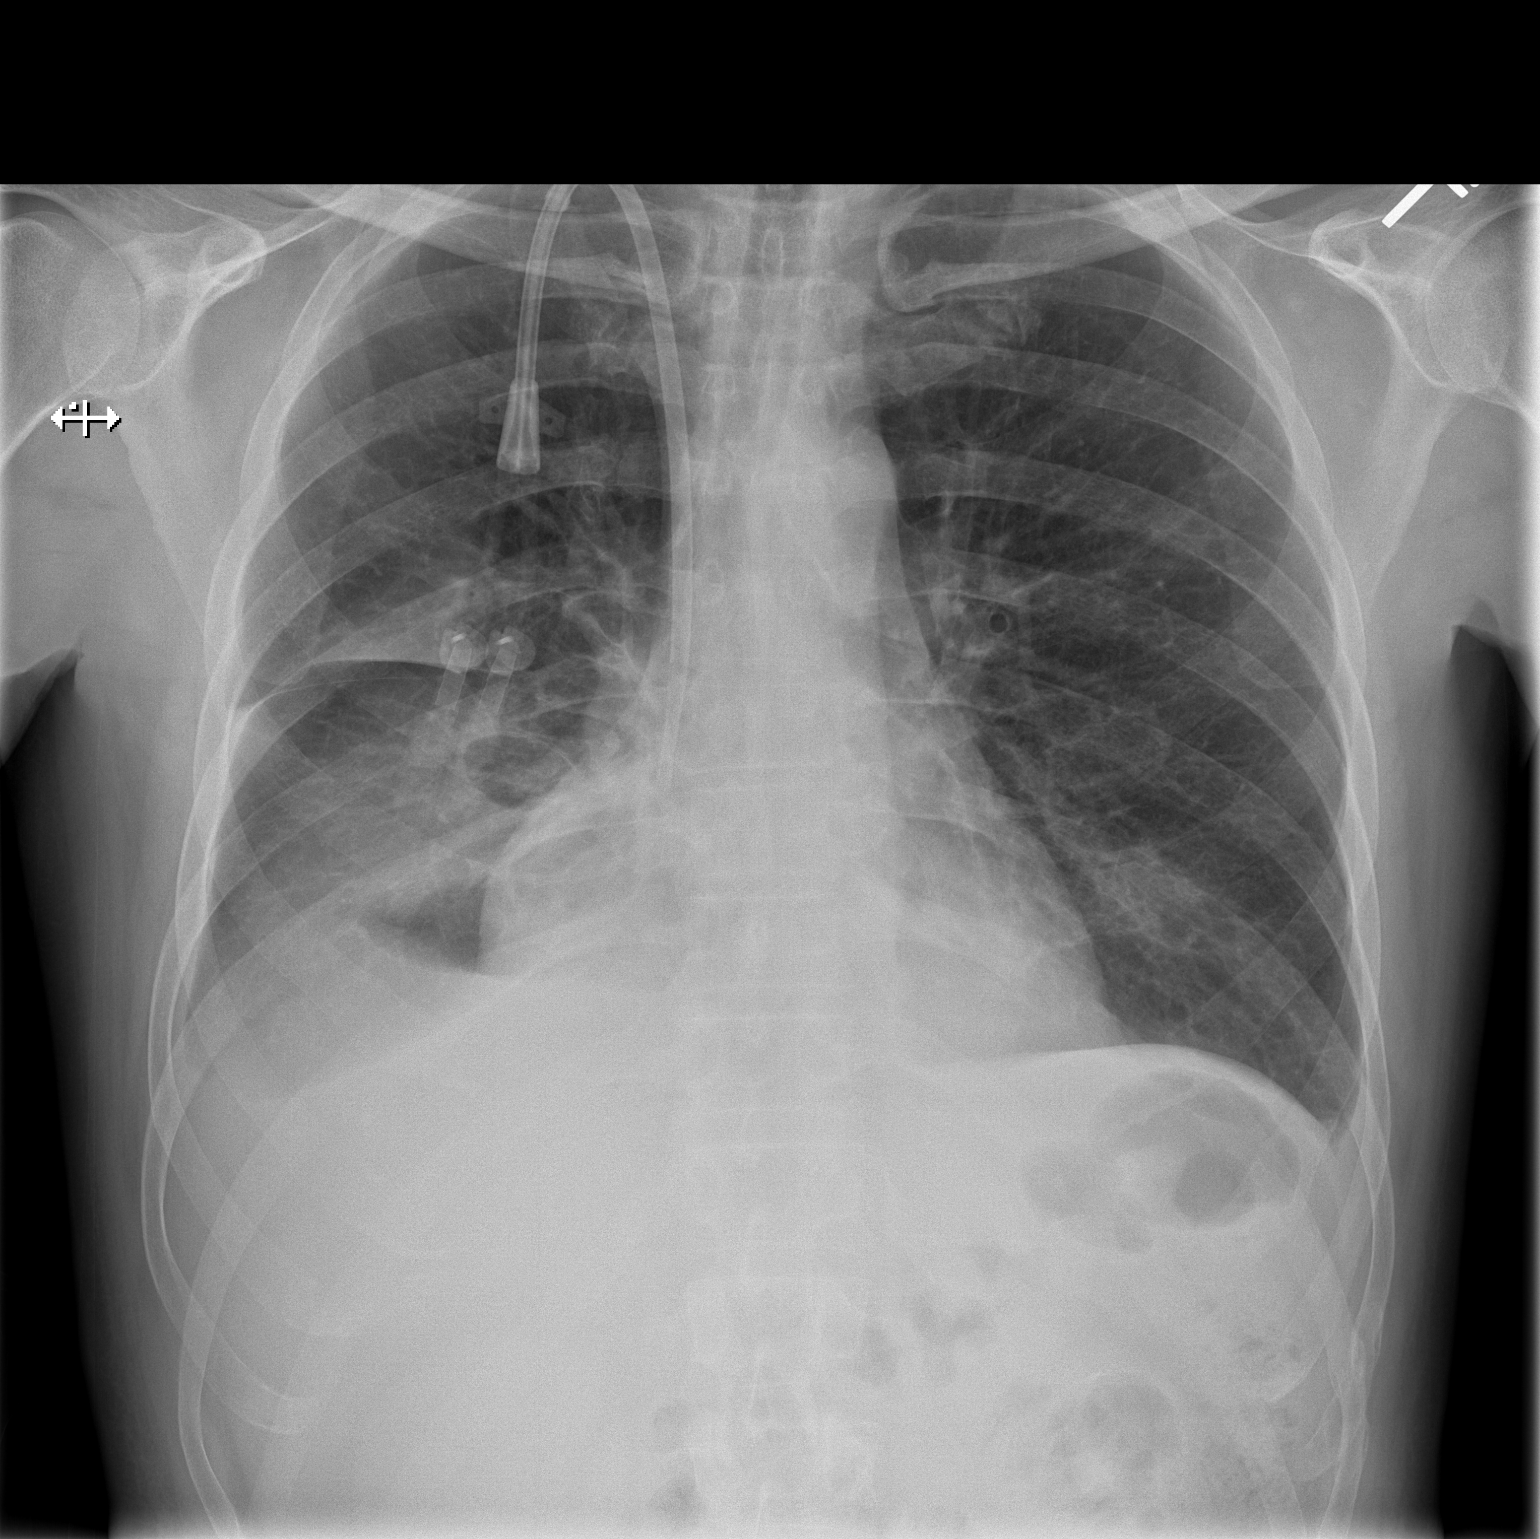

[w chest lat]
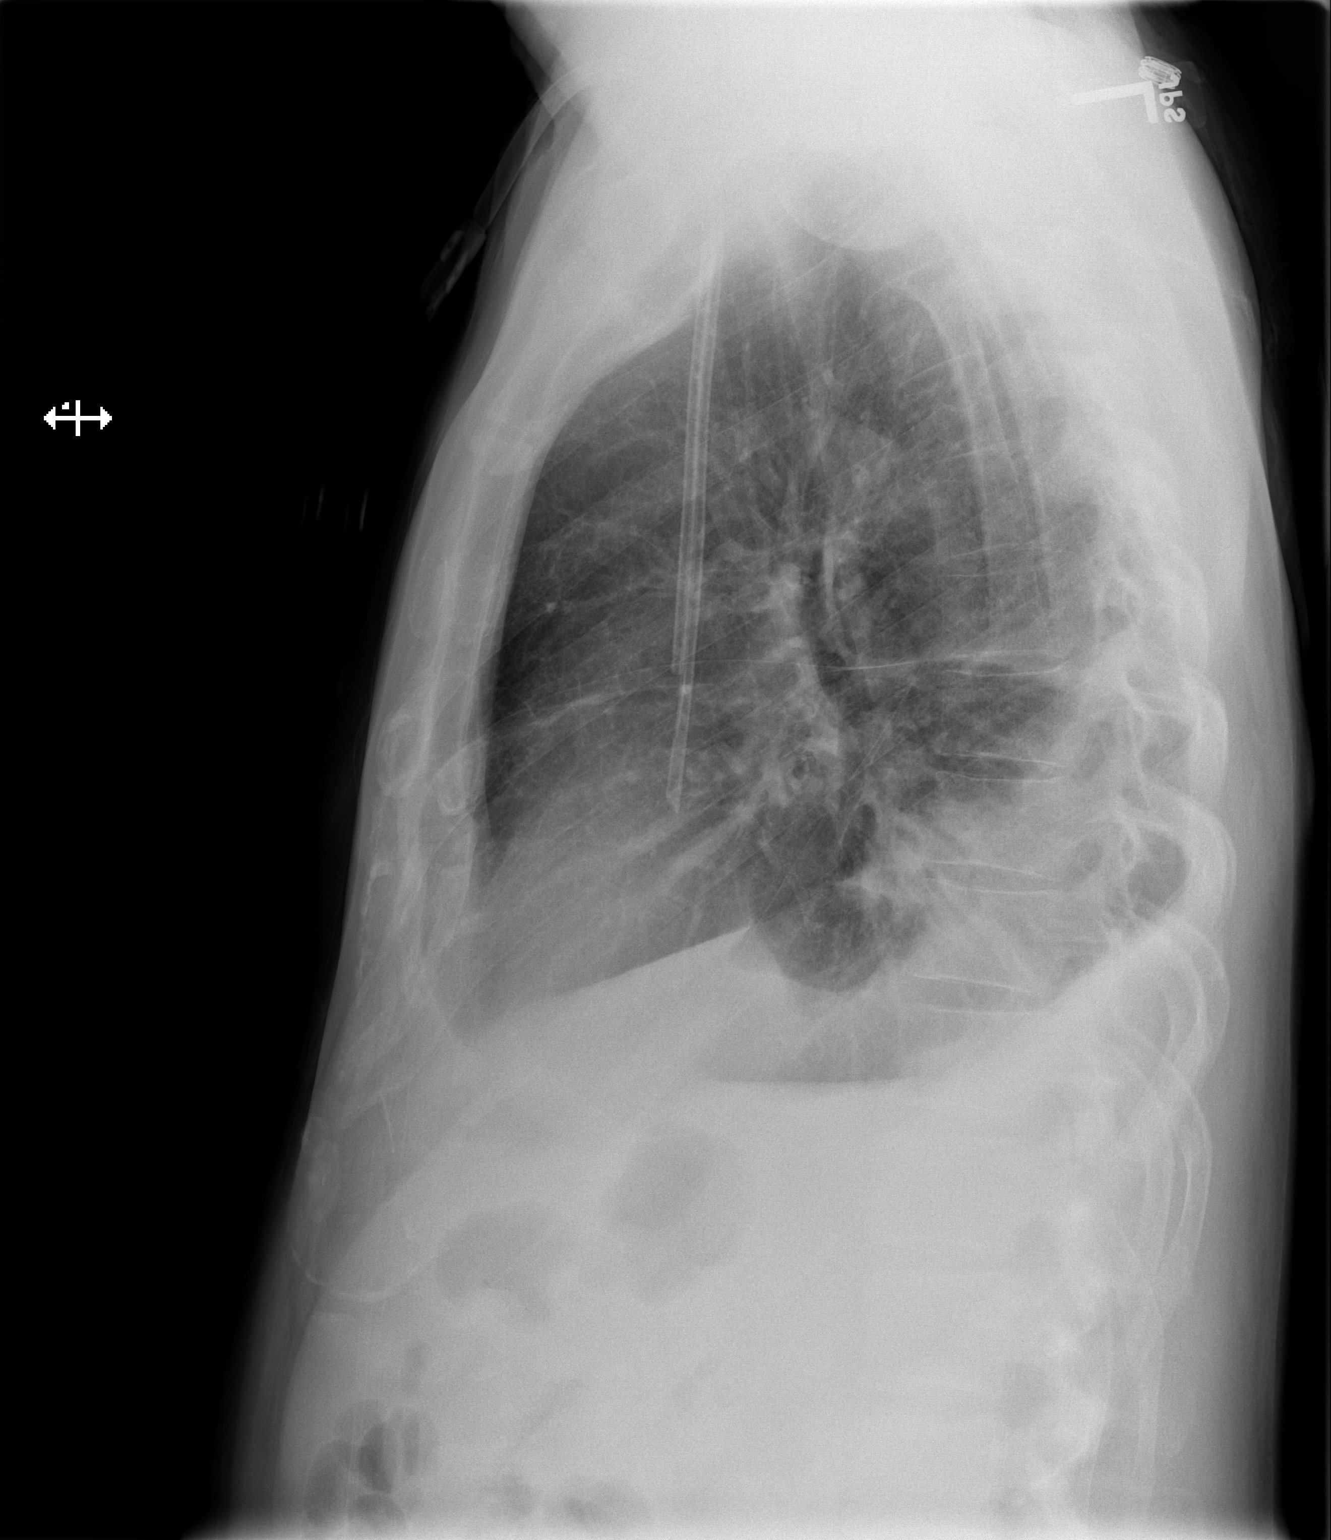

[2 of 2 positions shown; findings below may reference images not displayed]

FINDINGS: Trachea is midline.  Heart size normal.  Right IJ
dialysis catheter tips project over the SVC.  There are bilateral
pleural effusions, right greater than left.  Right pleural effusion
may have decreased slightly in the interval.  Right basilar
airspace disease is seen in association.  Minimal left basilar
airspace disease, with improvement in aeration from 01/31/2011.  No
edema.
IMPRESSION: 1.  Bilateral pleural effusions, right greater than left, with
slight decrease in size on the right from 01/31/2011.
2.  Bibasilar air space disease, right greater than left, with
improved aeration at the left lung base.

## 2013-06-25 ENCOUNTER — Telehealth: Payer: Self-pay | Admitting: Family Medicine

## 2013-06-25 NOTE — Telephone Encounter (Signed)
Sympathy Card sent.
